# Patient Record
Sex: Male | Born: 1955 | Race: Black or African American | Hispanic: No | Marital: Single | Smoking: Never smoker
Health system: Southern US, Community
[De-identification: ages and names within clinical notes are randomized; demographics above are authoritative.]

---

## 2020-07-27 ENCOUNTER — Other Ambulatory Visit: Payer: Self-pay

## 2020-07-27 ENCOUNTER — Inpatient Hospital Stay (HOSPITAL_COMMUNITY)
Admission: EM | Admit: 2020-07-27 | Discharge: 2020-08-17 | DRG: 981 | Disposition: E | Payer: HRSA Program | Attending: Family Medicine | Admitting: Family Medicine

## 2020-07-27 ENCOUNTER — Emergency Department (HOSPITAL_COMMUNITY): Payer: HRSA Program

## 2020-07-27 DIAGNOSIS — E876 Hypokalemia: Secondary | ICD-10-CM | POA: Diagnosis not present

## 2020-07-27 DIAGNOSIS — I700 Atherosclerosis of aorta: Secondary | ICD-10-CM | POA: Diagnosis present

## 2020-07-27 DIAGNOSIS — G9341 Metabolic encephalopathy: Secondary | ICD-10-CM | POA: Diagnosis present

## 2020-07-27 DIAGNOSIS — A4151 Sepsis due to Escherichia coli [E. coli]: Secondary | ICD-10-CM | POA: Diagnosis present

## 2020-07-27 DIAGNOSIS — R680 Hypothermia, not associated with low environmental temperature: Secondary | ICD-10-CM | POA: Diagnosis present

## 2020-07-27 DIAGNOSIS — J0100 Acute maxillary sinusitis, unspecified: Secondary | ICD-10-CM | POA: Diagnosis present

## 2020-07-27 DIAGNOSIS — R627 Adult failure to thrive: Secondary | ICD-10-CM | POA: Diagnosis present

## 2020-07-27 DIAGNOSIS — E1122 Type 2 diabetes mellitus with diabetic chronic kidney disease: Secondary | ICD-10-CM | POA: Diagnosis present

## 2020-07-27 DIAGNOSIS — R112 Nausea with vomiting, unspecified: Secondary | ICD-10-CM

## 2020-07-27 DIAGNOSIS — A4189 Other specified sepsis: Secondary | ICD-10-CM

## 2020-07-27 DIAGNOSIS — J1282 Pneumonia due to coronavirus disease 2019: Secondary | ICD-10-CM | POA: Diagnosis present

## 2020-07-27 DIAGNOSIS — E43 Unspecified severe protein-calorie malnutrition: Secondary | ICD-10-CM

## 2020-07-27 DIAGNOSIS — I82442 Acute embolism and thrombosis of left tibial vein: Secondary | ICD-10-CM | POA: Diagnosis present

## 2020-07-27 DIAGNOSIS — I9751 Accidental puncture and laceration of a circulatory system organ or structure during a circulatory system procedure: Secondary | ICD-10-CM | POA: Diagnosis not present

## 2020-07-27 DIAGNOSIS — U071 COVID-19: Principal | ICD-10-CM | POA: Diagnosis present

## 2020-07-27 DIAGNOSIS — Z681 Body mass index (BMI) 19 or less, adult: Secondary | ICD-10-CM

## 2020-07-27 DIAGNOSIS — D62 Acute posthemorrhagic anemia: Secondary | ICD-10-CM | POA: Diagnosis not present

## 2020-07-27 DIAGNOSIS — N390 Urinary tract infection, site not specified: Secondary | ICD-10-CM | POA: Diagnosis present

## 2020-07-27 DIAGNOSIS — I70262 Atherosclerosis of native arteries of extremities with gangrene, left leg: Secondary | ICD-10-CM | POA: Diagnosis present

## 2020-07-27 DIAGNOSIS — R64 Cachexia: Secondary | ICD-10-CM | POA: Diagnosis present

## 2020-07-27 DIAGNOSIS — E872 Acidosis, unspecified: Secondary | ICD-10-CM | POA: Diagnosis present

## 2020-07-27 DIAGNOSIS — I82452 Acute embolism and thrombosis of left peroneal vein: Secondary | ICD-10-CM | POA: Diagnosis present

## 2020-07-27 DIAGNOSIS — R1310 Dysphagia, unspecified: Secondary | ICD-10-CM | POA: Diagnosis present

## 2020-07-27 DIAGNOSIS — R0902 Hypoxemia: Secondary | ICD-10-CM

## 2020-07-27 DIAGNOSIS — I4581 Long QT syndrome: Secondary | ICD-10-CM | POA: Diagnosis present

## 2020-07-27 DIAGNOSIS — Z794 Long term (current) use of insulin: Secondary | ICD-10-CM

## 2020-07-27 DIAGNOSIS — A419 Sepsis, unspecified organism: Secondary | ICD-10-CM

## 2020-07-27 DIAGNOSIS — Y838 Other surgical procedures as the cause of abnormal reaction of the patient, or of later complication, without mention of misadventure at the time of the procedure: Secondary | ICD-10-CM | POA: Diagnosis not present

## 2020-07-27 DIAGNOSIS — N179 Acute kidney failure, unspecified: Secondary | ICD-10-CM | POA: Diagnosis present

## 2020-07-27 DIAGNOSIS — E11621 Type 2 diabetes mellitus with foot ulcer: Secondary | ICD-10-CM | POA: Diagnosis present

## 2020-07-27 DIAGNOSIS — E1152 Type 2 diabetes mellitus with diabetic peripheral angiopathy with gangrene: Secondary | ICD-10-CM | POA: Diagnosis present

## 2020-07-27 DIAGNOSIS — E559 Vitamin D deficiency, unspecified: Secondary | ICD-10-CM | POA: Diagnosis present

## 2020-07-27 DIAGNOSIS — R9431 Abnormal electrocardiogram [ECG] [EKG]: Secondary | ICD-10-CM | POA: Diagnosis present

## 2020-07-27 DIAGNOSIS — L97519 Non-pressure chronic ulcer of other part of right foot with unspecified severity: Secondary | ICD-10-CM | POA: Diagnosis present

## 2020-07-27 DIAGNOSIS — E875 Hyperkalemia: Secondary | ICD-10-CM | POA: Diagnosis present

## 2020-07-27 DIAGNOSIS — N400 Enlarged prostate without lower urinary tract symptoms: Secondary | ICD-10-CM | POA: Diagnosis present

## 2020-07-27 DIAGNOSIS — L97528 Non-pressure chronic ulcer of other part of left foot with other specified severity: Secondary | ICD-10-CM | POA: Diagnosis present

## 2020-07-27 DIAGNOSIS — J9601 Acute respiratory failure with hypoxia: Secondary | ICD-10-CM | POA: Diagnosis present

## 2020-07-27 DIAGNOSIS — E1110 Type 2 diabetes mellitus with ketoacidosis without coma: Secondary | ICD-10-CM | POA: Diagnosis present

## 2020-07-27 DIAGNOSIS — I82411 Acute embolism and thrombosis of right femoral vein: Secondary | ICD-10-CM | POA: Diagnosis present

## 2020-07-27 DIAGNOSIS — N1832 Chronic kidney disease, stage 3b: Secondary | ICD-10-CM | POA: Diagnosis present

## 2020-07-27 DIAGNOSIS — E1169 Type 2 diabetes mellitus with other specified complication: Secondary | ICD-10-CM | POA: Diagnosis present

## 2020-07-27 DIAGNOSIS — E782 Mixed hyperlipidemia: Secondary | ICD-10-CM | POA: Diagnosis present

## 2020-07-27 LAB — CBG MONITORING, ED: Glucose-Capillary: >600 mg/dL (ref 70–99)

## 2020-07-27 MED ORDER — LACTATED RINGERS IV BOLUS
1000.0000 mL | Freq: Once | INTRAVENOUS | Status: AC
  Administered 2020-07-27: 1000 mL via INTRAVENOUS

## 2020-07-27 NOTE — ED Notes (Signed)
RN unable to start IV, EMS tried as well. IV team consulted.

## 2020-07-27 NOTE — ED Triage Notes (Signed)
Pt arrived from home where family called due to pt "not acting like self" and family unable to manage pt's glucose. Family reported pt sugar has been high and they administered 8 units Humalog about 3 hours ago. CBG with EMS 386. Pt oriented but slow to respond. Pt also has wounds on toes, family stated pt always wears compression stockings so they were unaware, pt states they have been like this for about 8 months.

## 2020-07-27 NOTE — ED Notes (Signed)
Please call [facility] [place] @ [phone] for status update--Gentle Hoge

## 2020-07-27 NOTE — ED Provider Notes (Signed)
[facility] Provider Note   CSN: [id number] Arrival date & time: 08/09/2020  2223     History Chief Complaint  Patient presents with  . Altered Mental Status  . Hyperglycemia    Todd MaxinJames Gomer is a 64 y.o. male.  Patient to ED from home for uncontrolled blood sugar. Per EMS, the family reports he is "not acting like himself" today. The patient denies pain, vomiting, diarrhea. He reports he does not feel well. He denies SOB. He reports he does not have a POA and makes his own medical decisions.   The history is provided by the patient and the EMS personnel. No language interpreter was used.  Altered Mental Status Presenting symptoms: confusion   Associated symptoms: weakness   Associated symptoms: no fever   Hyperglycemia Associated symptoms: altered mental status, confusion and weakness   Associated symptoms: no fever        No past medical history on file.  Patient Active Problem List   Diagnosis Date Noted  . Diabetic ketoacidosis (HCC) 07/28/2020    No family history on file.  Social History   Tobacco Use  . Smoking status: Not on file  Substance Use Topics  . Alcohol use: Not on file  . Drug use: Not on file    Home Medications Prior to Admission medications   Not on File    Allergies    Patient has no known allergies.  Review of Systems   Review of Systems  Constitutional: Negative for chills and fever.  HENT: Negative.   Respiratory: Negative.   Cardiovascular: Negative.   Gastrointestinal: Negative.   Genitourinary: Negative.   Musculoskeletal: Negative.   Skin: Negative.   Neurological: Positive for weakness.  Psychiatric/Behavioral: Positive for confusion.    Physical Exam Updated Vital Signs BP (!) 154/86   Pulse (!) 102   Temp (!) 97.1 F (36.2 C) (Rectal)   Resp (!) 27   Ht 5\' 7"  (1.702 m)   Wt 43.1 kg   SpO2 96%   BMI 14.88 kg/m   Physical Exam Vitals and nursing note reviewed.    Constitutional:      Appearance: He is well-developed. He is ill-appearing.     Comments: Patient underweight, immaciated  HENT:     Head: Normocephalic.     Mouth/Throat:     Mouth: Mucous membranes are dry.     Comments: Majority of dentition missing Eyes:     Conjunctiva/sclera: Conjunctivae normal.  Cardiovascular:     Rate and Rhythm: Regular rhythm. Tachycardia present.     Heart sounds: No murmur heard.   Pulmonary:     Effort: Pulmonary effort is normal.     Breath sounds: Normal breath sounds. No wheezing, rhonchi or rales.     Comments: Tachypneic Abdominal:     General: Bowel sounds are normal. There is no distension.     Palpations: Abdomen is soft.     Tenderness: There is no abdominal tenderness. There is no guarding or rebound.  Musculoskeletal:        General: Normal range of motion.     Cervical back: Normal range of motion and neck supple.     Right lower leg: No edema.     Left lower leg: No edema.  Skin:    General: Skin is warm and dry.     Comments: Poor skin turgor  Neurological:     Mental Status: He is alert.     Comments: Patient oriented to  person, place. Unable to provide details of medical history. Awake, alert. Follows command. No lateralizing weakness.     ED Results / Procedures / Treatments   Labs (all labs ordered are listed, but only abnormal results are displayed) Labs Reviewed  SARS CORONAVIRUS 2 BY RT PCR (HOSPITAL ORDER, PERFORMED IN [facility]) - Abnormal; Notable for the following components:      Result Value   SARS Coronavirus 2 POSITIVE (*)    All other components within normal limits  CBC WITH DIFFERENTIAL/PLATELET - Abnormal; Notable for the following components:   WBC 15.9 (*)    RBC 6.48 (*)    Hemoglobin 18.2 (*)    HCT 59.2 (*)    Neutro Abs 14.6 (*)    Abs Immature Granulocytes 0.31 (*)    All other components within normal limits  COMPREHENSIVE METABOLIC PANEL - Abnormal; Notable for the following  components:   Potassium 6.2 (*)    Chloride 85 (*)    CO2 11 (*)    Glucose, Bld 788 (*)    BUN 131 (*)    Creatinine, Ser 4.38 (*)    Calcium 8.7 (*)    Total Protein 8.3 (*)    Albumin 2.4 (*)    AST 47 (*)    Total Bilirubin 3.2 (*)    GFR calc non Af Amer 13 (*)    GFR calc Af Amer 15 (*)    Anion gap 39 (*)    All other components within normal limits  LACTIC ACID, PLASMA - Abnormal; Notable for the following components:   Lactic Acid, Venous 8.6 (*)    All other components within normal limits  LACTIC ACID, PLASMA - Abnormal; Notable for the following components:   Lactic Acid, Venous 3.8 (*)    All other components within normal limits  BETA-HYDROXYBUTYRIC ACID - Abnormal; Notable for the following components:   Beta-Hydroxybutyric Acid 6.29 (*)    All other components within normal limits  CBG MONITORING, ED - Abnormal; Notable for the following components:   Glucose-Capillary >600 (*)    All other components within normal limits  I-STAT VENOUS BLOOD GAS, ED - Abnormal; Notable for the following components:   pCO2, Ven 35.9 (*)    pO2, Ven 100.0 (*)    Acid-base deficit 4.0 (*)    Calcium, Ion 0.88 (*)    All other components within normal limits  CBG MONITORING, ED - Abnormal; Notable for the following components:   Glucose-Capillary 497 (*)    All other components within normal limits  CBG MONITORING, ED - Abnormal; Notable for the following components:   Glucose-Capillary 465 (*)    All other components within normal limits  CULTURE, BLOOD (ROUTINE X 2)  CULTURE, BLOOD (ROUTINE X 2)  URINE CULTURE  PROTIME-INR  APTT  URINALYSIS, ROUTINE W REFLEX MICROSCOPIC  BETA-HYDROXYBUTYRIC ACID  BETA-HYDROXYBUTYRIC ACID  BASIC METABOLIC PANEL  I-STAT ARTERIAL BLOOD GAS, ED   Results for orders placed or performed during the hospital encounter of 08/09/2020  SARS Coronavirus 2 by RT PCR (hospital order, performed in [facility] hospital lab) Nasopharyngeal  Nasopharyngeal Swab   Specimen: Nasopharyngeal Swab  Result Value Ref Range   SARS Coronavirus 2 POSITIVE (A) NEGATIVE  CBC with Differential  Result Value Ref Range   WBC 15.9 (H) 4.0 - 10.5 K/uL   RBC 6.48 (H) 4.22 - 5.81 MIL/uL   Hemoglobin 18.2 (H) 13.0 - 17.0 g/dL   HCT 93.2 (H) 39 - 52 %  MCV 91.4 80.0 - 100.0 fL   MCH 28.1 26.0 - 34.0 pg   MCHC 30.7 30.0 - 36.0 g/dL   RDW 19.1 47.8 - 29.5 %   Platelets 360 150 - 400 K/uL   nRBC 0.0 0.0 - 0.2 %   Neutrophils Relative % 92 %   Neutro Abs 14.6 (H) 1.7 - 7.7 K/uL   Lymphocytes Relative 4 %   Lymphs Abs 0.7 0.7 - 4.0 K/uL   Monocytes Relative 2 %   Monocytes Absolute 0.3 0 - 1 K/uL   Eosinophils Relative 0 %   Eosinophils Absolute 0.0 0 - 0 K/uL   Basophils Relative 0 %   Basophils Absolute 0.0 0 - 0 K/uL   Immature Granulocytes 2 %   Abs Immature Granulocytes 0.31 (H) 0.00 - 0.07 K/uL  Comprehensive metabolic panel  Result Value Ref Range   Sodium 135 135 - 145 mmol/L   Potassium 6.2 (H) 3.5 - 5.1 mmol/L   Chloride 85 (L) 98 - 111 mmol/L   CO2 11 (L) 22 - 32 mmol/L   Glucose, Bld 788 (HH) 70 - 99 mg/dL   BUN 621 (H) 8 - 23 mg/dL   Creatinine, Ser 3.08 (H) 0.61 - 1.24 mg/dL   Calcium 8.7 (L) 8.9 - 10.3 mg/dL   Total Protein 8.3 (H) 6.5 - 8.1 g/dL   Albumin 2.4 (L) 3.5 - 5.0 g/dL   AST 47 (H) 15 - 41 U/L   ALT 15 0 - 44 U/L   Alkaline Phosphatase 83 38 - 126 U/L   Total Bilirubin 3.2 (H) 0.3 - 1.2 mg/dL   GFR calc non Af Amer 13 (L) >60 mL/min   GFR calc Af Amer 15 (L) >60 mL/min   Anion gap 39 (H) 5 - 15  Lactic acid, plasma  Result Value Ref Range   Lactic Acid, Venous 8.6 (HH) 0.5 - 1.9 mmol/L  Lactic acid, plasma  Result Value Ref Range   Lactic Acid, Venous 3.8 (HH) 0.5 - 1.9 mmol/L  Protime-INR  Result Value Ref Range   Prothrombin Time 13.9 11.4 - 15.2 seconds   INR 1.1 0.8 - 1.2  APTT  Result Value Ref Range   aPTT 28 24 - 36 seconds  Beta-hydroxybutyric acid  Result Value Ref Range    Beta-Hydroxybutyric Acid 6.29 (H) 0.05 - 0.27 mmol/L  CBG monitoring, ED  Result Value Ref Range   Glucose-Capillary >600 (HH) 70 - 99 mg/dL  I-Stat Venous Blood Gas, ED  Result Value Ref Range   pH, Ven 7.367 7.25 - 7.43   pCO2, Ven 35.9 (L) 44 - 60 mmHg   pO2, Ven 100.0 (H) 32 - 45 mmHg   Bicarbonate 20.6 20.0 - 28.0 mmol/L   TCO2 22 22 - 32 mmol/L   O2 Saturation 98.0 %   Acid-base deficit 4.0 (H) 0.0 - 2.0 mmol/L   Sodium 136 135 - 145 mmol/L   Potassium 4.1 3.5 - 5.1 mmol/L   Calcium, Ion 0.88 (LL) 1.15 - 1.40 mmol/L   HCT 48.0 39 - 52 %   Hemoglobin 16.3 13.0 - 17.0 g/dL   Sample type VENOUS   CBG monitoring, ED  Result Value Ref Range   Glucose-Capillary 497 (H) 70 - 99 mg/dL  CBG monitoring, ED  Result Value Ref Range   Glucose-Capillary 465 (H) 70 - 99 mg/dL    EKG None  Radiology DG Chest Port 1 View  Result Date: August 09, 2020 CLINICAL DATA:  Shortness of breath EXAM:  PORTABLE CHEST 1 VIEW COMPARISON:  01/27/2014 FINDINGS: Low lung volumes. Left basilar airspace opacity, new since prior study. Heart is normal size. Right lung clear. No effusions or acute bony abnormality. IMPRESSION: Low lung volumes. Left basilar airspace opacity concerning for pneumonia. Electronically Signed   By: Charlett Nose M.D.   On: 07/21/2020 23:03    Procedures Procedures (including critical care time) CRITICAL CARE Performed by: Arnoldo Hooker   Total critical care time: 55 minutes  Critical care time was exclusive of separately billable procedures and treating other patients.  Critical care was necessary to treat or prevent imminent or life-threatening deterioration.  Critical care was time spent personally by me on the following activities: development of treatment plan with patient and/or surrogate as well as nursing, discussions with consultants, evaluation of patient's response to treatment, examination of patient, obtaining history from patient or surrogate, ordering and  performing treatments and interventions, ordering and review of laboratory studies, ordering and review of radiographic studies, pulse oximetry and re-evaluation of patient's condition.  Medications Ordered in ED Medications  metroNIDAZOLE (FLAGYL) IVPB 500 mg (0 mg Intravenous Stopped 07/28/20 0135)  insulin regular, human (MYXREDLIN) 100 units/ 100 mL infusion (6 Units/hr Intravenous Rate/Dose Change 07/28/20 0322)  dextrose 5 % in lactated ringers infusion (has no administration in time range)  dextrose 50 % solution 0-50 mL (has no administration in time range)  lactated ringers bolus 1,000 mL (0 mLs Intravenous Stopped 07/28/20 0106)  ceFEPIme (MAXIPIME) 2 g in sodium chloride 0.9 % 100 mL IVPB (0 g Intravenous Stopped 07/28/20 0053)  vancomycin (VANCOCIN) IVPB 1000 mg/200 mL premix (0 mg Intravenous Stopped 07/28/20 0250)  lactated ringers bolus 500 mL (0 mLs Intravenous Stopped 07/28/20 0134)  0.9 %  sodium chloride infusion ( Intravenous New Bag/Given 07/28/20 0144)    ED Course  I have reviewed the triage vital signs and the nursing notes.  Pertinent labs & imaging results that were available during my care of the patient were reviewed by me and considered in my medical decision making (see chart for details).    MDM Rules/Calculators/A&P                          Neuro exam  Patient to ED with c/o "not acting right" and uncontrolled blood sugar.   The patient appears chronically ill, dry. He meets sepsis criteria with hypotension, tachycardia. Protocol started, including 30 ml/kg LR bolus, abx for unknown source.   Hypothermic with rectal temp 95 - bear hugger applied. Will recheck and observe.   Hypoxic to 85% on arrival to ED requiring oxygen support. Eventually weaned to 4L - maintaining 90%. No respiratory difficulty. Denies SoB.  Initial CBG is "high". He is found to be is DKA with Glu 788, CO2 11, Anion Gap 39. DKA orders started to include insulin drip with endotool. No  vomiting. K+ 6.2. Expect improvement with glycemic control, to be addressed with DKA standing orders. LR changed to NS for fluid resuscitation.   Cr elevated to 4.38, BUN 131. Unsure of baseline renal function. Expect improvement with fluids.   Patient has a left basilar opacity on CXR. He is COVID positive. ^^^the patient expresses he has religious beliefs that keep him from being COVID tested. Mental capacity questionable. He will require admission. He agrees to allow the test but does not want to receive any results. Dr. Wilkie Aye is involved in this discussion with the patient.   Lactic acid 8.6.  Critical care consulted. Requests pH which results as normal. Repeat Lactic acid much improved at 3.8. Patient remains awake and alert, unchanged mental status on serial exams.   PH is normal on VBG (7.367). Lactic acid improving and is 3.8 on repeat value. Hospitalist paged for admission. Discussed with Dr. Leafy Half, [facility], who accepts for admission.   Final Clinical Impression(s) / ED Diagnoses Final diagnoses:  Sepsis (HCC)  DKA COVID+ AKI  Rx / DC Orders ED Discharge Orders    None       Elpidio Anis, PA-C 07/28/20 0416    Shon Baton, MD 07/28/20 [phone]

## 2020-07-27 NOTE — ED Notes (Signed)
RN explained to pt that covid test was ordered, pt allowed RN to swab him. Pt informed PA afterwards that covid swab is against his religion. Swab disposed of per pt request. PA is aware.

## 2020-07-27 NOTE — ED Notes (Signed)
RN informed PA that not all lab work is able to be obtained.

## 2020-07-27 NOTE — ED Notes (Signed)
PA notified of pt temperature. Bear Hugger placed on pt.

## 2020-07-27 NOTE — ED Notes (Signed)
Pt RR 44 on arrival and O2 saturation was 85%. Pt placed on 4L [place] and improved to 89%. Pt now on non-rebreather at 95%

## 2020-07-27 NOTE — ED Notes (Signed)
IV team only able to obtain one culture bottle. Unable to obtain second set.

## 2020-07-28 ENCOUNTER — Encounter (HOSPITAL_COMMUNITY): Payer: Self-pay | Admitting: Internal Medicine

## 2020-07-28 ENCOUNTER — Inpatient Hospital Stay (HOSPITAL_COMMUNITY): Payer: HRSA Program

## 2020-07-28 DIAGNOSIS — R627 Adult failure to thrive: Secondary | ICD-10-CM | POA: Diagnosis present

## 2020-07-28 DIAGNOSIS — G9341 Metabolic encephalopathy: Secondary | ICD-10-CM

## 2020-07-28 DIAGNOSIS — I82452 Acute embolism and thrombosis of left peroneal vein: Secondary | ICD-10-CM | POA: Diagnosis present

## 2020-07-28 DIAGNOSIS — U071 COVID-19: Principal | ICD-10-CM

## 2020-07-28 DIAGNOSIS — N179 Acute kidney failure, unspecified: Secondary | ICD-10-CM | POA: Diagnosis present

## 2020-07-28 DIAGNOSIS — J9601 Acute respiratory failure with hypoxia: Secondary | ICD-10-CM | POA: Diagnosis present

## 2020-07-28 DIAGNOSIS — N390 Urinary tract infection, site not specified: Secondary | ICD-10-CM | POA: Diagnosis present

## 2020-07-28 DIAGNOSIS — E875 Hyperkalemia: Secondary | ICD-10-CM | POA: Diagnosis present

## 2020-07-28 DIAGNOSIS — E43 Unspecified severe protein-calorie malnutrition: Secondary | ICD-10-CM | POA: Diagnosis present

## 2020-07-28 DIAGNOSIS — A4151 Sepsis due to Escherichia coli [E. coli]: Secondary | ICD-10-CM | POA: Diagnosis present

## 2020-07-28 DIAGNOSIS — N1832 Chronic kidney disease, stage 3b: Secondary | ICD-10-CM

## 2020-07-28 DIAGNOSIS — N400 Enlarged prostate without lower urinary tract symptoms: Secondary | ICD-10-CM

## 2020-07-28 DIAGNOSIS — I70262 Atherosclerosis of native arteries of extremities with gangrene, left leg: Secondary | ICD-10-CM | POA: Diagnosis present

## 2020-07-28 DIAGNOSIS — E872 Acidosis, unspecified: Secondary | ICD-10-CM | POA: Diagnosis present

## 2020-07-28 DIAGNOSIS — I469 Cardiac arrest, cause unspecified: Secondary | ICD-10-CM | POA: Diagnosis not present

## 2020-07-28 DIAGNOSIS — I82411 Acute embolism and thrombosis of right femoral vein: Secondary | ICD-10-CM | POA: Diagnosis present

## 2020-07-28 DIAGNOSIS — L97528 Non-pressure chronic ulcer of other part of left foot with other specified severity: Secondary | ICD-10-CM | POA: Diagnosis present

## 2020-07-28 DIAGNOSIS — I9751 Accidental puncture and laceration of a circulatory system organ or structure during a circulatory system procedure: Secondary | ICD-10-CM | POA: Diagnosis not present

## 2020-07-28 DIAGNOSIS — E1169 Type 2 diabetes mellitus with other specified complication: Secondary | ICD-10-CM

## 2020-07-28 DIAGNOSIS — I82442 Acute embolism and thrombosis of left tibial vein: Secondary | ICD-10-CM | POA: Diagnosis present

## 2020-07-28 DIAGNOSIS — Z681 Body mass index (BMI) 19 or less, adult: Secondary | ICD-10-CM | POA: Diagnosis not present

## 2020-07-28 DIAGNOSIS — D62 Acute posthemorrhagic anemia: Secondary | ICD-10-CM | POA: Diagnosis not present

## 2020-07-28 DIAGNOSIS — R609 Edema, unspecified: Secondary | ICD-10-CM | POA: Diagnosis not present

## 2020-07-28 DIAGNOSIS — Y838 Other surgical procedures as the cause of abnormal reaction of the patient, or of later complication, without mention of misadventure at the time of the procedure: Secondary | ICD-10-CM | POA: Diagnosis not present

## 2020-07-28 DIAGNOSIS — E1110 Type 2 diabetes mellitus with ketoacidosis without coma: Secondary | ICD-10-CM | POA: Diagnosis present

## 2020-07-28 DIAGNOSIS — L97519 Non-pressure chronic ulcer of other part of right foot with unspecified severity: Secondary | ICD-10-CM | POA: Diagnosis present

## 2020-07-28 DIAGNOSIS — E1152 Type 2 diabetes mellitus with diabetic peripheral angiopathy with gangrene: Secondary | ICD-10-CM | POA: Diagnosis present

## 2020-07-28 DIAGNOSIS — A4189 Other specified sepsis: Secondary | ICD-10-CM

## 2020-07-28 DIAGNOSIS — R64 Cachexia: Secondary | ICD-10-CM | POA: Diagnosis present

## 2020-07-28 DIAGNOSIS — E559 Vitamin D deficiency, unspecified: Secondary | ICD-10-CM

## 2020-07-28 DIAGNOSIS — J1282 Pneumonia due to coronavirus disease 2019: Secondary | ICD-10-CM | POA: Diagnosis present

## 2020-07-28 HISTORY — DX: Type 2 diabetes mellitus with other specified complication: E11.69

## 2020-07-28 HISTORY — DX: Vitamin D deficiency, unspecified: E55.9

## 2020-07-28 HISTORY — DX: Benign prostatic hyperplasia without lower urinary tract symptoms: N40.0

## 2020-07-28 LAB — BLOOD CULTURE ID PANEL (REFLEXED) - BCID2

## 2020-07-28 LAB — CBC WITH DIFFERENTIAL/PLATELET
Abs Immature Granulocytes: 0.31 10*3/uL — ABNORMAL HIGH (ref 0.00–0.07)
Abs Immature Granulocytes: 0.22 10*3/uL — ABNORMAL HIGH (ref 0.00–0.07)
Basophils Absolute: 0 10*3/uL (ref 0.0–0.1)
Basophils Absolute: 0 10*3/uL (ref 0.0–0.1)
Basophils Relative: 0 %
Basophils Relative: 0 %
Eosinophils Absolute: 0 10*3/uL (ref 0.0–0.5)
Eosinophils Absolute: 0 10*3/uL (ref 0.0–0.5)
Eosinophils Relative: 0 %
Eosinophils Relative: 0 %
HCT: 59.2 % — ABNORMAL HIGH (ref 39.0–52.0)
HCT: 45.8 % (ref 39.0–52.0)
Hemoglobin: 18.2 g/dL — ABNORMAL HIGH (ref 13.0–17.0)
Hemoglobin: 14.8 g/dL (ref 13.0–17.0)
Immature Granulocytes: 2 %
Immature Granulocytes: 1 %
Lymphocytes Relative: 4 %
Lymphocytes Relative: 2 %
Lymphs Abs: 0.7 10*3/uL (ref 0.7–4.0)
Lymphs Abs: 0.4 10*3/uL — ABNORMAL LOW (ref 0.7–4.0)
MCH: 28.1 pg (ref 26.0–34.0)
MCH: 27.8 pg (ref 26.0–34.0)
MCHC: 30.7 g/dL (ref 30.0–36.0)
MCHC: 32.3 g/dL (ref 30.0–36.0)
MCV: 91.4 fL (ref 80.0–100.0)
MCV: 86.1 fL (ref 80.0–100.0)
Monocytes Absolute: 0.3 10*3/uL (ref 0.1–1.0)
Monocytes Absolute: 0.4 10*3/uL (ref 0.1–1.0)
Monocytes Relative: 2 %
Monocytes Relative: 2 %
Neutro Abs: 14.6 10*3/uL — ABNORMAL HIGH (ref 1.7–7.7)
Neutro Abs: 16.8 10*3/uL — ABNORMAL HIGH (ref 1.7–7.7)
Neutrophils Relative %: 92 %
Neutrophils Relative %: 95 %
Platelets: 360 10*3/uL (ref 150–400)
Platelets: 269 10*3/uL (ref 150–400)
RBC: 6.48 MIL/uL — ABNORMAL HIGH (ref 4.22–5.81)
RBC: 5.32 MIL/uL (ref 4.22–5.81)
RDW: 13.1 % (ref 11.5–15.5)
RDW: 12.3 % (ref 11.5–15.5)
WBC: 15.9 10*3/uL — ABNORMAL HIGH (ref 4.0–10.5)
WBC: 17.8 10*3/uL — ABNORMAL HIGH (ref 4.0–10.5)
nRBC: 0 % (ref 0.0–0.2)
nRBC: 0 % (ref 0.0–0.2)

## 2020-07-28 LAB — BASIC METABOLIC PANEL
Anion gap: 17 — ABNORMAL HIGH (ref 5–15)
Anion gap: 12 (ref 5–15)
Anion gap: 9 (ref 5–15)
BUN: 108 mg/dL — ABNORMAL HIGH (ref 8–23)
BUN: 95 mg/dL — ABNORMAL HIGH (ref 8–23)
BUN: 71 mg/dL — ABNORMAL HIGH (ref 8–23)
CO2: 24 mmol/L (ref 22–32)
CO2: 28 mmol/L (ref 22–32)
CO2: 20 mmol/L — ABNORMAL LOW (ref 22–32)
Calcium: 6.9 mg/dL — ABNORMAL LOW (ref 8.9–10.3)
Calcium: 8 mg/dL — ABNORMAL LOW (ref 8.9–10.3)
Calcium: 5.5 mg/dL — CL (ref 8.9–10.3)
Chloride: 101 mmol/L (ref 98–111)
Chloride: 105 mmol/L (ref 98–111)
Chloride: 116 mmol/L — ABNORMAL HIGH (ref 98–111)
Creatinine, Ser: 3.18 mg/dL — ABNORMAL HIGH (ref 0.61–1.24)
Creatinine, Ser: 2.75 mg/dL — ABNORMAL HIGH (ref 0.61–1.24)
Creatinine, Ser: 1.93 mg/dL — ABNORMAL HIGH (ref 0.61–1.24)
GFR calc Af Amer: 23 mL/min — ABNORMAL LOW (ref >60)
GFR calc Af Amer: 27 mL/min — ABNORMAL LOW (ref >60)
GFR calc Af Amer: 42 mL/min — ABNORMAL LOW (ref >60)
GFR calc non Af Amer: 20 mL/min — ABNORMAL LOW (ref >60)
GFR calc non Af Amer: 23 mL/min — ABNORMAL LOW (ref >60)
GFR calc non Af Amer: 36 mL/min — ABNORMAL LOW (ref >60)
Glucose, Bld: 303 mg/dL — ABNORMAL HIGH (ref 70–99)
Glucose, Bld: 109 mg/dL — ABNORMAL HIGH (ref 70–99)
Glucose, Bld: 300 mg/dL — ABNORMAL HIGH (ref 70–99)
Potassium: 3.1 mmol/L — ABNORMAL LOW (ref 3.5–5.1)
Potassium: 4.2 mmol/L (ref 3.5–5.1)
Potassium: 3.8 mmol/L (ref 3.5–5.1)
Sodium: 142 mmol/L (ref 135–145)
Sodium: 145 mmol/L (ref 135–145)
Sodium: 145 mmol/L (ref 135–145)

## 2020-07-28 LAB — CBG MONITORING, ED
Glucose-Capillary: 497 mg/dL — ABNORMAL HIGH (ref 70–99)
Glucose-Capillary: 465 mg/dL — ABNORMAL HIGH (ref 70–99)
Glucose-Capillary: 326 mg/dL — ABNORMAL HIGH (ref 70–99)
Glucose-Capillary: 220 mg/dL — ABNORMAL HIGH (ref 70–99)
Glucose-Capillary: 199 mg/dL — ABNORMAL HIGH (ref 70–99)
Glucose-Capillary: 127 mg/dL — ABNORMAL HIGH (ref 70–99)
Glucose-Capillary: 158 mg/dL — ABNORMAL HIGH (ref 70–99)
Glucose-Capillary: 148 mg/dL — ABNORMAL HIGH (ref 70–99)
Glucose-Capillary: 131 mg/dL — ABNORMAL HIGH (ref 70–99)
Glucose-Capillary: 139 mg/dL — ABNORMAL HIGH (ref 70–99)
Glucose-Capillary: 80 mg/dL (ref 70–99)
Glucose-Capillary: 143 mg/dL — ABNORMAL HIGH (ref 70–99)
Glucose-Capillary: 223 mg/dL — ABNORMAL HIGH (ref 70–99)

## 2020-07-28 LAB — COMPREHENSIVE METABOLIC PANEL
ALT: 15 U/L (ref 0–44)
AST: 47 U/L — ABNORMAL HIGH (ref 15–41)
Albumin: 2.4 g/dL — ABNORMAL LOW (ref 3.5–5.0)
Alkaline Phosphatase: 83 U/L (ref 38–126)
Anion gap: 39 — ABNORMAL HIGH (ref 5–15)
BUN: 131 mg/dL — ABNORMAL HIGH (ref 8–23)
CO2: 11 mmol/L — ABNORMAL LOW (ref 22–32)
Calcium: 8.7 mg/dL — ABNORMAL LOW (ref 8.9–10.3)
Chloride: 85 mmol/L — ABNORMAL LOW (ref 98–111)
Creatinine, Ser: 4.38 mg/dL — ABNORMAL HIGH (ref 0.61–1.24)
GFR calc Af Amer: 15 mL/min — ABNORMAL LOW (ref >60)
GFR calc non Af Amer: 13 mL/min — ABNORMAL LOW (ref >60)
Glucose, Bld: 788 mg/dL (ref 70–99)
Potassium: 6.2 mmol/L — ABNORMAL HIGH (ref 3.5–5.1)
Sodium: 135 mmol/L (ref 135–145)
Total Bilirubin: 3.2 mg/dL — ABNORMAL HIGH (ref 0.3–1.2)
Total Protein: 8.3 g/dL — ABNORMAL HIGH (ref 6.5–8.1)

## 2020-07-28 LAB — I-STAT VENOUS BLOOD GAS, ED
Acid-base deficit: 4 mmol/L — ABNORMAL HIGH (ref 0.0–2.0)
Bicarbonate: 20.6 mmol/L (ref 20.0–28.0)
Calcium, Ion: 0.88 mmol/L — CL (ref 1.15–1.40)
HCT: 48 % (ref 39.0–52.0)
Hemoglobin: 16.3 g/dL (ref 13.0–17.0)
O2 Saturation: 98 %
Potassium: 4.1 mmol/L (ref 3.5–5.1)
Sodium: 136 mmol/L (ref 135–145)
TCO2: 22 mmol/L (ref 22–32)
pCO2, Ven: 35.9 mmHg — ABNORMAL LOW (ref 44.0–60.0)
pH, Ven: 7.367 (ref 7.250–7.430)
pO2, Ven: 100 mmHg — ABNORMAL HIGH (ref 32.0–45.0)

## 2020-07-28 LAB — SEDIMENTATION RATE: Sed Rate: 62 mm/hr — ABNORMAL HIGH (ref 0–16)

## 2020-07-28 LAB — C-REACTIVE PROTEIN: CRP: 25.8 mg/dL — ABNORMAL HIGH (ref <1.0)

## 2020-07-28 LAB — PROTIME-INR
INR: 1.1 (ref 0.8–1.2)
Prothrombin Time: 13.9 seconds (ref 11.4–15.2)

## 2020-07-28 LAB — URINALYSIS, ROUTINE W REFLEX MICROSCOPIC
Bilirubin Urine: NEGATIVE
Glucose, UA: 150 mg/dL — AB
Ketones, ur: NEGATIVE mg/dL
Nitrite: NEGATIVE
Protein, ur: 30 mg/dL — AB
Specific Gravity, Urine: 1.012 (ref 1.005–1.030)
pH: 5 (ref 5.0–8.0)

## 2020-07-28 LAB — LACTIC ACID, PLASMA
Lactic Acid, Venous: 8.6 mmol/L (ref 0.5–1.9)
Lactic Acid, Venous: 3.8 mmol/L (ref 0.5–1.9)
Lactic Acid, Venous: 5.2 mmol/L (ref 0.5–1.9)
Lactic Acid, Venous: 3.2 mmol/L (ref 0.5–1.9)
Lactic Acid, Venous: 2 mmol/L (ref 0.5–1.9)

## 2020-07-28 LAB — BETA-HYDROXYBUTYRIC ACID
Beta-Hydroxybutyric Acid: 6.29 mmol/L — ABNORMAL HIGH (ref 0.05–0.27)
Beta-Hydroxybutyric Acid: 0.16 mmol/L (ref 0.05–0.27)
Beta-Hydroxybutyric Acid: 0.07 mmol/L (ref 0.05–0.27)
Beta-Hydroxybutyric Acid: 1.94 mmol/L — ABNORMAL HIGH (ref 0.05–0.27)

## 2020-07-28 LAB — T4, FREE: Free T4: 1.52 ng/dL — ABNORMAL HIGH (ref 0.61–1.12)

## 2020-07-28 LAB — HIV ANTIBODY (ROUTINE TESTING W REFLEX): HIV Screen 4th Generation wRfx: NONREACTIVE

## 2020-07-28 LAB — AMMONIA: Ammonia: 25 umol/L (ref 9–35)

## 2020-07-28 LAB — TSH: TSH: 1.405 u[IU]/mL (ref 0.350–4.500)

## 2020-07-28 LAB — PROCALCITONIN: Procalcitonin: 3.38 ng/mL

## 2020-07-28 LAB — SARS CORONAVIRUS 2 BY RT PCR (HOSPITAL ORDER, PERFORMED IN ~~LOC~~ HOSPITAL LAB): SARS Coronavirus 2: POSITIVE — AB

## 2020-07-28 LAB — APTT: aPTT: 28 seconds (ref 24–36)

## 2020-07-28 LAB — VITAMIN B12: Vitamin B-12: 1137 pg/mL — ABNORMAL HIGH (ref 180–914)

## 2020-07-28 MED ORDER — INSULIN REGULAR(HUMAN) IN NACL 100-0.9 UT/100ML-% IV SOLN
INTRAVENOUS | Status: DC
  Administered 2020-07-28: 5.5 [IU]/h via INTRAVENOUS
  Filled 2020-07-28: qty 100

## 2020-07-28 MED ORDER — LACTATED RINGERS IV SOLN: INTRAVENOUS | Status: DC

## 2020-07-28 MED ORDER — INSULIN ASPART 100 UNIT/ML ~~LOC~~ SOLN
0.0000 [IU] | SUBCUTANEOUS | Status: DC
  Administered 2020-07-28: 3 [IU] via SUBCUTANEOUS
  Administered 2020-07-29: 5 [IU] via SUBCUTANEOUS

## 2020-07-28 MED ORDER — POTASSIUM CHLORIDE 10 MEQ/100ML IV SOLN
10.0000 meq | INTRAVENOUS | Status: DC
  Administered 2020-07-28 (×3): 10 meq via INTRAVENOUS
  Filled 2020-07-28 (×2): qty 100

## 2020-07-28 MED ORDER — SODIUM CHLORIDE 0.9 % IV SOLN: 100.0000 mg | Freq: Every day | INTRAVENOUS | Status: DC

## 2020-07-28 MED ORDER — SODIUM CHLORIDE 0.9 % IV SOLN: Freq: Once | INTRAVENOUS | Status: AC

## 2020-07-28 MED ORDER — SODIUM CHLORIDE 0.9 % IV SOLN: INTRAVENOUS | Status: DC

## 2020-07-28 MED ORDER — CALCIUM GLUCONATE-NACL 1-0.675 GM/50ML-% IV SOLN
1.0000 g | Freq: Once | INTRAVENOUS | Status: AC
  Administered 2020-07-28: 1000 mg via INTRAVENOUS
  Filled 2020-07-28: qty 50

## 2020-07-28 MED ORDER — LACTATED RINGERS IV BOLUS
500.0000 mL | Freq: Once | INTRAVENOUS | Status: AC
  Administered 2020-07-28: 500 mL via INTRAVENOUS

## 2020-07-28 MED ORDER — POTASSIUM CHLORIDE CRYS ER 20 MEQ PO TBCR: 20.0000 meq | EXTENDED_RELEASE_TABLET | Freq: Once | ORAL | Status: DC

## 2020-07-28 MED ORDER — ONDANSETRON HCL 4 MG PO TABS: 4.0000 mg | ORAL_TABLET | Freq: Four times a day (QID) | ORAL | Status: DC | PRN

## 2020-07-28 MED ORDER — INSULIN GLARGINE 100 UNIT/ML ~~LOC~~ SOLN
4.0000 [IU] | SUBCUTANEOUS | Status: DC
  Administered 2020-07-28: 4 [IU] via SUBCUTANEOUS
  Filled 2020-07-28 (×2): qty 0.04

## 2020-07-28 MED ORDER — DEXTROSE 50 % IV SOLN: 0.0000 mL | INTRAVENOUS | Status: DC | PRN

## 2020-07-28 MED ORDER — SODIUM CHLORIDE 0.9 % IV SOLN: 1.0000 g | INTRAVENOUS | Status: DC

## 2020-07-28 MED ORDER — HEPARIN SODIUM (PORCINE) 5000 UNIT/ML IJ SOLN
5000.0000 [IU] | Freq: Three times a day (TID) | INTRAMUSCULAR | Status: DC
  Administered 2020-07-28 – 2020-07-31 (×10): 5000 [IU] via SUBCUTANEOUS
  Filled 2020-07-28 (×10): qty 1

## 2020-07-28 MED ORDER — DEXTROSE IN LACTATED RINGERS 5 % IV SOLN: INTRAVENOUS | Status: DC

## 2020-07-28 MED ORDER — SODIUM CHLORIDE 0.9 % IV SOLN
2.0000 g | Freq: Once | INTRAVENOUS | Status: AC
  Administered 2020-07-28: 2 g via INTRAVENOUS
  Filled 2020-07-28: qty 2

## 2020-07-28 MED ORDER — SODIUM CHLORIDE 0.9 % IV SOLN
100.0000 mg | Freq: Every day | INTRAVENOUS | Status: AC
  Administered 2020-07-29 – 2020-08-01 (×4): 100 mg via INTRAVENOUS
  Filled 2020-07-28 (×5): qty 20

## 2020-07-28 MED ORDER — ACETAMINOPHEN 325 MG PO TABS
650.0000 mg | ORAL_TABLET | Freq: Four times a day (QID) | ORAL | Status: DC | PRN
  Administered 2020-07-31 – 2020-08-10 (×3): 650 mg via ORAL
  Filled 2020-07-28 (×3): qty 2

## 2020-07-28 MED ORDER — DEXTROSE IN LACTATED RINGERS 5 % IV SOLN
INTRAVENOUS | Status: DC
Start: 2020-07-28 — End: 2020-07-28

## 2020-07-28 MED ORDER — SODIUM CHLORIDE 0.9 % IV SOLN
2.0000 g | INTRAVENOUS | Status: DC
  Administered 2020-07-28 – 2020-07-31 (×4): 2 g via INTRAVENOUS
  Filled 2020-07-28 (×4): qty 20

## 2020-07-28 MED ORDER — SODIUM CHLORIDE 0.9 % IV SOLN
200.0000 mg | Freq: Once | INTRAVENOUS | Status: AC
  Administered 2020-07-28: 200 mg via INTRAVENOUS
  Filled 2020-07-28: qty 200

## 2020-07-28 MED ORDER — VANCOMYCIN HCL IN DEXTROSE 1-5 GM/200ML-% IV SOLN
1000.0000 mg | Freq: Once | INTRAVENOUS | Status: AC
  Administered 2020-07-28: 1000 mg via INTRAVENOUS
  Filled 2020-07-28: qty 200

## 2020-07-28 MED ORDER — ALBUTEROL SULFATE HFA 108 (90 BASE) MCG/ACT IN AERS
2.0000 | INHALATION_SPRAY | Freq: Four times a day (QID) | RESPIRATORY_TRACT | Status: DC
  Administered 2020-07-28 – 2020-08-11 (×54): 2 via RESPIRATORY_TRACT
  Filled 2020-07-28 (×5): qty 6.7

## 2020-07-28 MED ORDER — ONDANSETRON HCL 4 MG/2ML IJ SOLN: 4.0000 mg | Freq: Four times a day (QID) | INTRAMUSCULAR | Status: DC | PRN

## 2020-07-28 MED ORDER — METRONIDAZOLE IN NACL 5-0.79 MG/ML-% IV SOLN
500.0000 mg | Freq: Three times a day (TID) | INTRAVENOUS | Status: DC
  Administered 2020-07-28: 500 mg via INTRAVENOUS
  Filled 2020-07-28: qty 100

## 2020-07-28 MED ORDER — SODIUM CHLORIDE 0.9 % IV BOLUS
1000.0000 mL | Freq: Once | INTRAVENOUS | Status: AC
  Administered 2020-07-28: 1000 mL via INTRAVENOUS

## 2020-07-28 MED ORDER — SODIUM CHLORIDE 0.9 % IV SOLN: 500.0000 mg | INTRAVENOUS | Status: DC

## 2020-07-28 MED ORDER — SODIUM CHLORIDE 0.9 % IV SOLN
500.0000 mg | INTRAVENOUS | Status: DC
  Administered 2020-07-28 – 2020-07-29 (×2): 500 mg via INTRAVENOUS
  Filled 2020-07-28 (×3): qty 500

## 2020-07-28 MED ORDER — KCL-LACTATED RINGERS-D5W 20 MEQ/L IV SOLN
INTRAVENOUS | Status: DC
  Filled 2020-07-28 (×2): qty 1000

## 2020-07-28 MED ORDER — SODIUM CHLORIDE 0.9 % IV SOLN: 200.0000 mg | Freq: Once | INTRAVENOUS | Status: DC

## 2020-07-28 MED ORDER — INSULIN REGULAR(HUMAN) IN NACL 100-0.9 UT/100ML-% IV SOLN: INTRAVENOUS | Status: DC

## 2020-07-28 MED ORDER — DEXAMETHASONE SODIUM PHOSPHATE 10 MG/ML IJ SOLN
6.0000 mg | INTRAMUSCULAR | Status: DC
  Administered 2020-07-28 – 2020-08-01 (×5): 6 mg via INTRAVENOUS
  Filled 2020-07-28 (×5): qty 1

## 2020-07-28 MED ORDER — POLYETHYLENE GLYCOL 3350 17 G PO PACK: 17.0000 g | PACK | Freq: Every day | ORAL | Status: DC | PRN

## 2020-07-28 NOTE — H&P (Signed)
History and Physical    Todd Farrell ZOX:[id number] DOB: 10-Jun-1956 DOA: 2020/08/06  PCP: Patient, No Pcp Per  Patient coming from: Home via EMS   Chief Complaint:  Chief Complaint  Patient presents with   Altered Mental Status   Hyperglycemia     HPI:    64 year old male with past medical history of diabetes mellitus type 2, hyperlipidemia, vitamin D deficiency, benign prostatic hyperplasia, chronic kidney disease stage IIIb who presents to [facility] emergency department via EMS after they were concerned by family that patient was becoming confused.  Patient is an extremely poor historian due to severe lethargy.  Unfortunately, we do not have any contact information that is accurate for family that the patient actually lives with.  According to the emergency department staff, EMS found the patient to be hypothermic and hypoxic.  Patient was initially placed on nonrebreather mask for oxygen supplementation and brought to [facility] for evaluation.  Upon evaluation in the emergency department patient was found to have multiple SIRS criteria including tachycardia and leukocytosis with severe lactic acidosis and evidence of DKA.  Patient was initiated on intravenous volume resuscitation as well as an insulin drip.  Patient was placed on broad-spectrum intravenous antibiotics.  Covid PCR testing did come back positive with evidence of left lower lobe infiltrate.  The hospitalist group was then called to assess the patient for admission to the hospital.   Review of Systems:   Unable to obtain review of systems due to patient's altered mentation.   Past Medical History:  Diagnosis Date   BPH (benign prostatic hyperplasia) 07/28/2020   Mixed diabetic hyperlipidemia associated with type 2 diabetes mellitus (HCC) 07/28/2020   Vitamin D deficiency 07/28/2020   Unable to obtain social history and family history due to altered mentation.  Prior to Admission medications     Not on File    Physical Exam: Vitals:   07/28/20 0543 07/28/20 0545 07/28/20 0629 07/28/20 0630  BP: 115/72 114/78 (!) 139/93 (!) 141/77  Pulse:  (!) 103 100   Resp: (!) 33 (!) 33 (!) 32 (!) 28  Temp:      TempSrc:      SpO2:  93% 93%   Weight:      Height:        Constitutional: Patient is lethargic but arousable and oriented x1.  Patient is currently not in any acute distress.  Patient is cachectic. Skin: no rashes, no lesions, extremely poor skin turgor noted. Eyes: Pupils are equally reactive to light.  No evidence of scleral icterus or conjunctival pallor.  ENMT: Moist mucous membranes noted.  Posterior pharynx clear of any exudate or lesions.   Neck: normal, supple, no masses, no thyromegaly.  No evidence of jugular venous distension.   Respiratory: clear to auscultation bilaterally, no wheezing, no crackles. Normal respiratory effort. No accessory muscle use.  Cardiovascular: Tachycardic rate, regular rhythm.  No murmurs / rubs / gallops. No extremity edema. 2+ pedal pulses. No carotid bruits.  Chest:   Nontender without crepitus or deformity.   Back:   Nontender without crepitus or deformity. Abdomen: Abdomen is soft and nontender.  No evidence of intra-abdominal masses.  Positive bowel sounds noted in all quadrants.   Musculoskeletal: No joint deformity upper and lower extremities. Good ROM, no contractures. Normal muscle tone.  Neurologic: Patient is lethargic but arousable and oriented x1.  Patient is not consistently following commands.  Patient is able to move all 4 extremities spontaneously.  Patient is  responsive to verbal and painful stimuli.  Psychiatric: Unable to assess due to significant lethargy.  Patient currently does not seem to possess insight as to his current situation.   Labs on Admission: I have personally reviewed following labs and imaging studies -   CBC: Recent Labs  Lab 08/06/2020 2329 07/28/20 0205  WBC 15.9*  --   NEUTROABS 14.6*  --   HGB  18.2* 16.3  HCT 59.2* 48.0  MCV 91.4  --   PLT 360  --    Basic Metabolic Panel: Recent Labs  Lab 07/20/2020 2329 07/28/20 0205 07/28/20 0536  NA 135 136 142  K 6.2* 4.1 3.1*  CL 85*  --  101  CO2 11*  --  24  GLUCOSE 788*  --  303*  BUN 131*  --  108*  CREATININE 4.38*  --  3.18*  CALCIUM 8.7*  --  6.9*   GFR: Estimated Creatinine Clearance: 14.5 mL/min (A) (by C-G formula based on SCr of 3.18 mg/dL (H)). Liver Function Tests: Recent Labs  Lab 07/28/2020 2329  AST 47*  ALT 15  ALKPHOS 83  BILITOT 3.2*  PROT 8.3*  ALBUMIN 2.4*   No results for input(s): LIPASE, AMYLASE in the last 168 hours. No results for input(s): AMMONIA in the last 168 hours. Coagulation Profile: Recent Labs  Lab 07/28/20 0117  INR 1.1   Cardiac Enzymes: No results for input(s): CKTOTAL, CKMB, CKMBINDEX, TROPONINI in the last 168 hours. BNP (last 3 results) No results for input(s): PROBNP in the last 8760 hours. HbA1C: No results for input(s): HGBA1C in the last 72 hours. CBG: Recent Labs  Lab 08/06/2020 2243 07/28/20 0202 07/28/20 0321 07/28/20 0500 07/28/20 0619  GLUCAP >600* 497* 465* 326* 220*   Lipid Profile: No results for input(s): CHOL, HDL, LDLCALC, TRIG, CHOLHDL, LDLDIRECT in the last 72 hours. Thyroid Function Tests: No results for input(s): TSH, T4TOTAL, FREET4, T3FREE, THYROIDAB in the last 72 hours. Anemia Panel: No results for input(s): VITAMINB12, FOLATE, FERRITIN, TIBC, IRON, RETICCTPCT in the last 72 hours. Urine analysis: No results found for: COLORURINE, APPEARANCEUR, LABSPEC, PHURINE, GLUCOSEU, HGBUR, BILIRUBINUR, KETONESUR, PROTEINUR, UROBILINOGEN, NITRITE, LEUKOCYTESUR  Radiological Exams on Admission - Personally Reviewed: DG Chest Port 1 View  Result Date: 07/31/2020 CLINICAL DATA:  Shortness of breath EXAM: PORTABLE CHEST 1 VIEW COMPARISON:  01/27/2014 FINDINGS: Low lung volumes. Left basilar airspace opacity, new since prior study. Heart is normal size.  Right lung clear. No effusions or acute bony abnormality. IMPRESSION: Low lung volumes. Left basilar airspace opacity concerning for pneumonia. Electronically Signed   By: Charlett Nose M.D.   On: 07/17/2020 23:03    Telemetry: Personally reviewed.  Rhythm is sinus tachycardia 110 bpm.  Assessment/Plan Principal Problem:   Sepsis due to COVID-19 [facility])   Patient presenting with multiple sirs criteria including tachycardia, hypothermia and leukocytosis with severe lactic acidosis, metabolic encephalopathy, acute kidney injury and acute hypoxic respiratory failure all thought to be secondary to COVID-19 infection with left lower lobe pneumonia.  Placing patient on intravenous Decadron and remdesivir  For now, due to severity of illness concurrently treating patient with intravenous ceftriaxone and azithromycin which can be quickly discontinued if patient's procalcitonin is unremarkable.  Hydrating patient aggressively with intravenous isotonic fluids especially considering the fact that patient is also suffering from DKA.  Blood cultures have been obtained.  Urinalysis and urine cultures have been ordered.  Providing patient with supplemental oxygen  Admitting to COVID-19 progressive bed.  Active Problems:  Diabetic ketoacidosis without coma associated with type 2 diabetes mellitus (HCC)   Patient presenting with severe hyperglycemia, significant anion gap acidosis and markedly elevated beta hydroxybutyrate concerning for diabetic ketoacidosis  VBG performed in the emergency department reveals a reassuring pH.  Patient is placed on insulin infusion per protocol  Hydrating patient aggressively with intravenous isotonic fluids  Serial chemistries every 4 hours  Patient currently n.p.o., diet will be initiated once gap is closed and basal insulin is initiated.    Lactic acidosis   Patient presenting with severe lactic acidosis thought to be secondary to volume depletion and  severe sepsis  Hydrating patient aggressively with intravenous isotonic fluids, treating underlying infection with intravenous antibiotics  Performing serial lactic acid levels to ensure downtrending and resolution.    Acute respiratory failure with hypoxia [facility](HCC)   Patient presenting with evidence of hypoxia requiring significant oxygen supplementation concerning for acute hypoxic respiratory failure  This is likely secondary to underlying COVID-19  Treating underlying cause of respiratory failure with intravenous Decadron, remdesivir and also antibacterials for now  Providing patient with as needed bronchodilator therapy via MDI  Weaning supplemental oxygen as tolerated.    Acute renal failure superimposed on stage 3b chronic kidney disease (HCC)   Patient suffering from substantial acute kidney injury superimposed on chronic kidney disease  Based on review of care everywhere records, patient has had a baseline creatinine in the past of 2.4.  Hydrating patient with intravenous fluids  Strict input and output monitoring  Monitoring renal function electrolytes with serial chemistries  Minimizing nephrotoxic agents as much as possible.    Acute metabolic encephalopathy   Patient presenting with significant lethargy and inability to participate with providing history or participating with examination.  This is thought to be secondary to encephalopathy due to underlying infection and volume depletion  Treating underlying infection and hydrating patient aggressively, will monitor for symptomatic improvement.  If patient fails to clinically improve will expand work-up of encephalopathy.    Hyperkalemia, diminished renal excretion   Patient presenting with hyperkalemia of 6.2  Potassium shifting cocktail has already been ordered by the emergency department provider.  Monitoring patient on telemetry  Performing serial chemistries to ensure downtrending of  potassium.  Etiology is likely secondary to renal injury.    Severe protein-calorie malnutrition (HCC)  Patient presenting with cachexia and markedly low BMI of 14.88  Patient likely suffering from severe protein calorie malnutrition.  Patient is currently n.p.o. due to DKA will likely need to be placed on nutritional supplements going forward.  Nutrition consultation placed, their advice is appreciated.    Code Status:  Full code Family Communication: Unable to contact any family at place of residence.  Status is: Inpatient  Remains inpatient appropriate because:Persistent severe electrolyte disturbances, Altered mental status, Ongoing diagnostic testing needed not appropriate for outpatient work up, IV treatments appropriate due to intensity of illness or inability to take PO and Inpatient level of care appropriate due to severity of illness   Dispo: The patient is from: Home              Anticipated d/c is to: Home              Anticipated d/c date is: > 3 days              Patient currently is not medically stable to d/c.        Marinda ElkGeorge J Brinlyn Cena MD [facility] Pager [phone]- [phone]  If 7PM-7AM, please contact night-coverage  [email or web address] Use universal [place] password for that web site. If you do not have the password, please call the hospital operator.  07/28/2020, 6:57 AM

## 2020-07-28 NOTE — ED Notes (Signed)
Pt incontinent of urine and stool. Male external catheter slide out of place. Pt cleaned and linens changed.

## 2020-07-28 NOTE — ED Notes (Signed)
Jasmine-- [phone]

## 2020-07-28 NOTE — ED Notes (Signed)
RT at bedside, attempted ABG collection without success. RN messaged PA to notify.

## 2020-07-28 NOTE — ED Notes (Signed)
Pt rectal temp. 98.9. Bear hugger removed, pt under warm blankets.

## 2020-07-28 NOTE — Progress Notes (Signed)
Secure chat Dr Allena Katz and primary RN re Midline order. Made aware that midline is not appropriate for the amount of medicine patient is getting right now. Patient has PIV x3 as charted in the flowsheets. Will follow up.

## 2020-07-28 NOTE — Progress Notes (Signed)
PHARMACY - PHYSICIAN COMMUNICATION CRITICAL VALUE ALERT - BLOOD CULTURE IDENTIFICATION (BCID)  BCID resulted in positive blood culture growing e.coli.  Urine culture sent.  Patient still admitted to [facility] when call received.  Name of physician (or Provider) Contacted: Patel  Changes to prescribed antibiotics required: Patient was already de-escalated to ceftriaxone earlier today. Primary team mentions respiratory symptoms (covid) so will continue azith for now.    Sheppard Coil PharmD., BCPS Clinical Pharmacist 07/28/2020 7:32 PM

## 2020-07-28 NOTE — ED Notes (Signed)
Microbiology called advised patient is Covid positive--Todd Farrell

## 2020-07-28 NOTE — ED Notes (Signed)
Spoke with sister, Owens Loffler -- [phone]-- states that pt's children would not let her in to see him last week. She is concerned that they are not taking care of him-- she says that here is at least 10-12 people living in the apt that her brother was in-- they are all complaining that they have a stomach bug. They would not seek care for pt for at least a week, stating their religion was preventing them from having him COVID tested or treated.  Owens Loffler -[phone] Kizzie Fantasia (father) [phone]

## 2020-07-28 NOTE — ED Notes (Signed)
RN notified Dr. Leafy Half about pt decreased potassium level. New order to be placed.

## 2020-07-28 NOTE — Progress Notes (Signed)
RT attempted ABG but was unsuccessful. RN aware.

## 2020-07-28 NOTE — ED Notes (Signed)
Placed on hospital inpt bed, mepilex applied to coocyx area-- no breakdown apparent. Great toes on both feet are black, not draining anything, states they have been like thus for "A few months"

## 2020-07-28 NOTE — ED Notes (Addendum)
Daughter - Leavy Cella on phone, wanted update on condition. This nurse told her of covid positivity- and isolation. Pt had given permissions to talk with this family member also. Explained to [facility] that pt is very ill and is not able to have visitors. Her concern is his toes, wants to know when they will be taken care of.

## 2020-07-28 NOTE — Progress Notes (Signed)
Inpatient Diabetes Program Recommendations  AACE/ADA: New Consensus Statement on Inpatient Glycemic Control (2015)  Target Ranges:  Prepandial:   less than 140 mg/dL      Peak postprandial:   less than 180 mg/dL (1-2 hours)      Critically ill patients:  140 - 180 mg/dL   Lab Results  Component Value Date   GLUCAP 199 (H) 07/28/2020    Review of Glycemic Control Results for Todd Farrell, Todd Farrell (MRN [id number]) as of 07/28/2020 08:54  Ref. Range 07/28/2020 05:36  Sodium Latest Ref Range: 135 - 145 mmol/L 142  Potassium Latest Ref Range: 3.5 - 5.1 mmol/L 3.1 (L)  Chloride Latest Ref Range: 98 - 111 mmol/L 101  CO2 Latest Ref Range: 22 - 32 mmol/L 24  Glucose Latest Ref Range: 70 - 99 mg/dL 725 (H)  BUN Latest Ref Range: 8 - 23 mg/dL 366 (H)  Creatinine Latest Ref Range: 0.61 - 1.24 mg/dL 4.40 (H)  Calcium Latest Ref Range: 8.9 - 10.3 mg/dL 6.9 (L)  Anion gap Latest Ref Range: 5 - 15  17 (H)   Diabetes history: DM 2 Outpatient Diabetes medications: on insulin unsure type and dose at this time Current orders for Inpatient glycemic control:  IV insulin/Endotool  A1c in process  Inpatient Diabetes Program Recommendations:    Note CO2 within normal waiting current BMET results and Beta hydroxybutyric acid. Gap elevated still.  At time of transition utilize the COVID glycemic control order set dosing insulin based on last insulin gtt rate.  Also consider Tradjenta 5 mg Daily  Thanks,  Christena Deem RN, MSN, BC-ADM Inpatient Diabetes Coordinator Team Pager [phone] (8a-5p)

## 2020-07-28 NOTE — ED Notes (Signed)
RN informed that MD spoke to pt in regards to covid swab, RN sending swab to lab. Results not to be dicussed with pt.

## 2020-07-28 NOTE — Progress Notes (Signed)
[facility] PLAN OF CARE NOTE Patient: Todd Farrell BVQ:[id number]   PCP: Patient, No Pcp Per DOB: 1956/02/06   DOA: 08/09/2020   DOS: 07/28/2020    Patient was admitted by my colleague earlier on 07/28/2020. I have reviewed the H&P as well as assessment and plan and agree with the same. Important changes in the plan are listed below.  Plan of care: Principal Problem:   Sepsis due to COVID-19 [facility]) Active Problems:   Diabetic ketoacidosis without coma associated with type 2 diabetes mellitus (HCC)   Lactic acidosis   Acute respiratory failure with hypoxia (HCC)   Acute renal failure superimposed on stage 3b chronic kidney disease (HCC)   Acute metabolic encephalopathy   Hyperkalemia, diminished renal excretion   Severe protein-calorie malnutrition (HCC) DKA resolved. Transition to basal bolus regimen. Every 4 hours due to patient's poor p.o. intake. Speech therapy consulted for mentation changes. CT head as well as encephalopathy work-up ordered. BC ID positive for E. coli currently on IV ceftriaxone continue.   Author: Lynden Oxford, MD [facility] 07/28/2020 7:54 PM   If 7PM-7AM, please contact night-coverage at [email or web address]

## 2020-07-28 NOTE — ED Notes (Signed)
RN informed Dr. Martyn Malay of increased potassium.

## 2020-07-29 ENCOUNTER — Other Ambulatory Visit: Payer: Self-pay

## 2020-07-29 DIAGNOSIS — U071 COVID-19: Secondary | ICD-10-CM | POA: Diagnosis not present

## 2020-07-29 DIAGNOSIS — A4189 Other specified sepsis: Secondary | ICD-10-CM | POA: Diagnosis not present

## 2020-07-29 LAB — BASIC METABOLIC PANEL
Anion gap: 13 (ref 5–15)
BUN: 90 mg/dL — ABNORMAL HIGH (ref 8–23)
CO2: 27 mmol/L (ref 22–32)
Calcium: 8 mg/dL — ABNORMAL LOW (ref 8.9–10.3)
Chloride: 105 mmol/L (ref 98–111)
Creatinine, Ser: 2.56 mg/dL — ABNORMAL HIGH (ref 0.61–1.24)
GFR calc Af Amer: 30 mL/min — ABNORMAL LOW (ref >60)
GFR calc non Af Amer: 26 mL/min — ABNORMAL LOW (ref >60)
Glucose, Bld: 296 mg/dL — ABNORMAL HIGH (ref 70–99)
Potassium: 4.1 mmol/L (ref 3.5–5.1)
Sodium: 145 mmol/L (ref 135–145)

## 2020-07-29 LAB — URINE CULTURE: Culture: <10000 — AB

## 2020-07-29 LAB — RPR
RPR Ser Ql: REACTIVE — AB
RPR Titer: 1:1 {titer}

## 2020-07-29 LAB — BETA-HYDROXYBUTYRIC ACID: Beta-Hydroxybutyric Acid: 1.39 mmol/L — ABNORMAL HIGH (ref 0.05–0.27)

## 2020-07-29 LAB — CBG MONITORING, ED
Glucose-Capillary: 251 mg/dL — ABNORMAL HIGH (ref 70–99)
Glucose-Capillary: 181 mg/dL — ABNORMAL HIGH (ref 70–99)
Glucose-Capillary: 102 mg/dL — ABNORMAL HIGH (ref 70–99)
Glucose-Capillary: 91 mg/dL (ref 70–99)

## 2020-07-29 LAB — HEMOGLOBIN A1C
Hgb A1c MFr Bld: 14 % — ABNORMAL HIGH (ref 4.8–5.6)
Mean Plasma Glucose: 355 mg/dL

## 2020-07-29 LAB — D-DIMER, QUANTITATIVE: D-Dimer, Quant: 0.99 ug/mL-FEU — ABNORMAL HIGH (ref 0.00–0.50)

## 2020-07-29 MED ORDER — INSULIN ASPART 100 UNIT/ML ~~LOC~~ SOLN
0.0000 [IU] | SUBCUTANEOUS | Status: DC
  Administered 2020-07-29: 2 [IU] via SUBCUTANEOUS
  Administered 2020-07-30: 11 [IU] via SUBCUTANEOUS
  Administered 2020-07-30: 2 [IU] via SUBCUTANEOUS
  Administered 2020-07-30: 8 [IU] via SUBCUTANEOUS
  Administered 2020-07-30: 2 [IU] via SUBCUTANEOUS

## 2020-07-29 MED ORDER — INSULIN DETEMIR 100 UNIT/ML ~~LOC~~ SOLN
0.1000 [IU]/kg | Freq: Two times a day (BID) | SUBCUTANEOUS | Status: DC
  Administered 2020-07-29 – 2020-07-30 (×4): 4 [IU] via SUBCUTANEOUS
  Filled 2020-07-29 (×5): qty 0.04

## 2020-07-29 MED ORDER — LINAGLIPTIN 5 MG PO TABS
5.0000 mg | ORAL_TABLET | Freq: Every day | ORAL | Status: DC
  Administered 2020-07-29 – 2020-07-31 (×3): 5 mg via ORAL
  Filled 2020-07-29 (×3): qty 1

## 2020-07-29 MED ORDER — LACTATED RINGERS IV SOLN: INTRAVENOUS | Status: DC

## 2020-07-29 NOTE — Progress Notes (Signed)
[facility] Progress Note  Patient: Todd Farrell    HYI:[id number]  DOA: 08/14/2020     Date of Service: the patient was seen and examined on 07/29/2020  Brief hospital course: diabetes mellitus type 2, hyperlipidemia, vitamin D deficiency, benign prostatic hyperplasia, chronic kidney disease stage IIIb  Presents with DKA, sepsis secondary to UTI and pneumonia as well as COVID-19. Currently plan is continue antibiotics.  Assessment and Plan: 1.  Sepsis due to combined community-acquired pneumonia as well as UTI and bacteremia with E. coli. Also secondary to COVID-19. POA. Met SIRS criteria with lactic acidosis, encephalopathy, tachycardia, hypothermia as well as leukocytosis. Treated aggressively with IV fluids. Currently sepsis physiology has resolved. Lactic acidosis has resolved as well. Blood cultures positive for E. coli. Continuing IV ceftriaxone. CT shows evidence of sinusitis as well as patient has pneumonia on the chest x-ray therefore we will continue azithromycin. Monitor response to treatment.  2.  Acute metabolic encephalopathy In the setting of sepsis and DKA. Currently getting better. Initially was NPO. Currently will initiate dysphagia 1 diet. Speech therapy consulted. CT scan negative for any acute stroke. Metabolic work-up unremarkable  3.  DKA Uncontrolled type 2 diabetes mellitus with hyperglycemia with renal complication Presented with hyperglycemia, anion gap acidosis as well as elevated beta hydroxybutyric acid. Treated with IV insulin. Currently on basal bolus regimen. Monitor.  4.  Acute COVID-19 Viral Pneumonia Lab Results  Component Value Date   SARSCOV2NAA POSITIVE (A) 07/17/2020   CXR: hazy bilateral peripheral opacities CT chest: GGO, consolidation Recent Labs    07/28/20 1701 07/29/20 0258  DDIMER  --  0.99*  CRP 25.8*  --    Tmax last 24 hours:  Temp (24hrs), Avg:97.7 F (36.5 C), Min:97.7 F (36.5 C), Max:97.7 F (36.5  C)  Oxygen requirements: 3 LPM.  Antibiotics: Ceftriaxone and azithromycin for UTI and pneumonia Vitamin C and Zinc: Currently n.p.o. DVT Prophylaxis: heparin injection 5,000 Units Start: 07/28/20 1400 Remdesivir: Started on 07/28/2020 Steroids: Decadron started on 07/20/2020 Baricitinib/Actemra(off-label use): Not a candidate due to sepsis and bacteremia Prone positioning: Patient encouraged to stay in prone position as much as possible.  The treatment plan and use of medications and known side effects were discussed with patient/family. It was clearly explained that there is no proven definitive treatment for COVID-19 infection yet. Complete risks and long-term side effects are unknown, however in the best clinical judgment they seem to be of some clinical benefit rather than medical risks. Patient/family agree with the treatment plan and want to receive these treatments as indicated.   5.  Hyperkalemia Acute kidney injury on chronic kidney disease stage IIIb Renal function actually improving. Continue with IV hydration.  Monitor.  Potassium also improving.  Monitor.  6.  Severe protein calorie malnutrition Failure to thrive Body mass index is 14.88 kg/m.  Dietary consulted. Monitor. At risk for poor outcome.  7.  Bilateral great toe ulceration Suspect in the setting of PVD. Check ABI. Currently on antibiotic. Presents with an acutely infected.  8.  Poor IV access. Difficult to obtain labs. Currently monitoring closely.  Patient has a 20-gauge IV.  Diet: Dysphagia 1 diet DVT Prophylaxis:   heparin injection 5,000 Units Start: 07/28/20 1400    Advance goals of care discussion: Full code  Family Communication: no family was present at bedside, at the time of interview.  The pt provided permission to discuss medical plan with the family.  Discussed with patient's daughter per patient's request. opportunity was given to ask question  and all questions were answered  satisfactorily.   Disposition:  Status is: Inpatient  Remains inpatient appropriate because:IV treatments appropriate due to intensity of illness or inability to take PO   Dispo: The patient is from: Home              Anticipated d/c is to: SNF              Anticipated d/c date is: 3 days              Patient currently is not medically stable to d/c.        Subjective: No nausea no vomiting.  No fever no chills.  Denies any confusion denies any chest pain.  Physical Exam:  General: Appear in mild distress, no Rash; Oral Mucosa Clear, moist. no Abnormal Neck Mass Or lumps, Conjunctiva normal  Cardiovascular: S1 and S2 Present, no Murmur, Respiratory: good respiratory effort, Bilateral Air entry present and bilateral  Crackles, no wheezes Abdomen: Bowel Sound present, Soft and no tenderness Extremities: no Pedal edema, no calf tenderness Neurology: alert and oriented to time, place, and person affect appropriate. no new focal deficit Gait not checked due to patient safety concerns  Vitals:   07/29/20 1635 07/29/20 1730 07/29/20 1800 07/29/20 1900  BP:  (!) 144/82 (!) 133/107 130/86  Pulse:  78 77 81  Resp:  (!) 30 (!) 27 (!) 28  Temp: 97.7 F (36.5 C)     TempSrc: Rectal     SpO2:  91% 93% 92%  Weight:      Height:        Intake/Output Summary (Last 24 hours) at 07/29/2020 1922 Last data filed at 07/29/2020 1602 Gross per 24 hour  Intake --  Output 1200 ml  Net -1200 ml   Filed Weights   07/28/20 0012  Weight: 43.1 kg    Data Reviewed: I have personally reviewed and interpreted daily labs, tele strips, imagings as discussed above. I reviewed all nursing notes, pharmacy notes, vitals, pertinent old records I have discussed plan of care as described above with RN and patient/family.  CBC: Recent Labs  Lab 07/21/2020 2329 07/28/20 0205 07/28/20 1701  WBC 15.9*  --  17.8*  NEUTROABS 14.6*  --  16.8*  HGB 18.2* 16.3 14.8  HCT 59.2* 48.0 45.8  MCV 91.4  --   86.1  PLT 360  --  622   Basic Metabolic Panel: Recent Labs  Lab 08/06/2020 2329 07/23/2020 2329 07/28/20 0205 07/28/20 0536 07/28/20 1701 07/28/20 2212 07/29/20 0226  NA 135   < > 136 142 145 145 145  K 6.2*   < > 4.1 3.1* 4.2 3.8 4.1  CL 85*  --   --  101 105 116* 105  CO2 11*  --   --  24 28 20* 27  GLUCOSE 788*  --   --  303* 109* 300* 296*  BUN 131*  --   --  108* 95* 71* 90*  CREATININE 4.38*  --   --  3.18* 2.75* 1.93* 2.56*  CALCIUM 8.7*  --   --  6.9* 8.0* 5.5* 8.0*   < > = values in this interval not displayed.    Studies: No results found.  Scheduled Meds: . albuterol  2 puff Inhalation Q6H  . dexamethasone (DECADRON) injection  6 mg Intravenous Q24H  . heparin  5,000 Units Subcutaneous Q8H  . insulin aspart  0-15 Units Subcutaneous Q4H  . insulin detemir  0.1 Units/kg Subcutaneous BID  .  linagliptin  5 mg Oral Daily   Continuous Infusions: . azithromycin Stopped (07/29/20 1203)  . cefTRIAXone (ROCEPHIN)  IV Stopped (07/29/20 1052)  . lactated ringers 125 mL/hr at 07/29/20 1354  . remdesivir 100 mg in NS 100 mL Stopped (07/29/20 1052)   PRN Meds: acetaminophen, ondansetron **OR** ondansetron (ZOFRAN) IV, polyethylene glycol  Time spent: 35 minutes  Author: Berle Mull, MD [facility] 07/29/2020 7:22 PM  To reach On-call, see care teams to locate the attending and reach out via [email or web address]. Between 7PM-7AM, please contact night-coverage If you still have difficulty reaching the attending provider, please page the [facility] (Director on Call) for [facility] on amion for assistance.

## 2020-07-29 NOTE — ED Notes (Signed)
Patient used Gaffer. Pt was instructed to attempt using it during commercials. Pt understood teaching.

## 2020-07-29 NOTE — ED Notes (Signed)
Rachelle (Sister#[phone]) called for a status on her brother condition.

## 2020-07-29 NOTE — ED Notes (Signed)
Pt removed [place], de sat to 84%. [place] reapplied. O2 bumped up to 4L [place]. O2 reading 93%.

## 2020-07-29 NOTE — ED Notes (Signed)
Pt left and right great toe are dry and necrotic. Nail bed in right great toe is missing. Patient states they are painful when touched.

## 2020-07-29 NOTE — ED Notes (Signed)
Call the sister Boykin Reaper at [phone] she states she called earlier and has not gotten a call back

## 2020-07-30 ENCOUNTER — Encounter (HOSPITAL_COMMUNITY): Payer: Self-pay | Admitting: Internal Medicine

## 2020-07-30 DIAGNOSIS — R9431 Abnormal electrocardiogram [ECG] [EKG]: Secondary | ICD-10-CM | POA: Diagnosis present

## 2020-07-30 DIAGNOSIS — U071 COVID-19: Secondary | ICD-10-CM | POA: Diagnosis not present

## 2020-07-30 DIAGNOSIS — A4189 Other specified sepsis: Secondary | ICD-10-CM | POA: Diagnosis not present

## 2020-07-30 LAB — GLUCOSE, CAPILLARY
Glucose-Capillary: 128 mg/dL — ABNORMAL HIGH (ref 70–99)
Glucose-Capillary: 144 mg/dL — ABNORMAL HIGH (ref 70–99)
Glucose-Capillary: 148 mg/dL — ABNORMAL HIGH (ref 70–99)
Glucose-Capillary: 274 mg/dL — ABNORMAL HIGH (ref 70–99)
Glucose-Capillary: 308 mg/dL — ABNORMAL HIGH (ref 70–99)
Glucose-Capillary: 87 mg/dL (ref 70–99)

## 2020-07-30 LAB — CULTURE, BLOOD (ROUTINE X 2)

## 2020-07-30 LAB — MRSA PCR SCREENING: MRSA by PCR: NEGATIVE

## 2020-07-30 MED ORDER — SODIUM CHLORIDE 0.9 % IV SOLN
100.0000 mg | Freq: Two times a day (BID) | INTRAVENOUS | Status: DC
  Administered 2020-07-30 – 2020-07-31 (×4): 100 mg via INTRAVENOUS
  Filled 2020-07-30 (×6): qty 100

## 2020-07-30 MED ORDER — ADULT MULTIVITAMIN W/MINERALS CH
1.0000 | ORAL_TABLET | Freq: Every day | ORAL | Status: DC
  Administered 2020-07-30 – 2020-08-05 (×7): 1 via ORAL
  Filled 2020-07-30 (×7): qty 1

## 2020-07-30 MED ORDER — NEPRO/CARBSTEADY PO LIQD
237.0000 mL | Freq: Two times a day (BID) | ORAL | Status: DC
  Administered 2020-07-30 – 2020-08-03 (×8): 237 mL via ORAL

## 2020-07-30 MED ORDER — ENSURE ENLIVE PO LIQD
237.0000 mL | Freq: Two times a day (BID) | ORAL | Status: DC
  Administered 2020-07-30: 237 mL via ORAL

## 2020-07-30 MED ORDER — LORAZEPAM 0.5 MG PO TABS
0.5000 mg | ORAL_TABLET | Freq: Once | ORAL | Status: AC
  Administered 2020-07-30: 0.5 mg via ORAL
  Filled 2020-07-30: qty 1

## 2020-07-30 NOTE — Progress Notes (Signed)
Notified Dr. Antionette Char of reported prolonged QTc.  EKG ordered and done. Dr.Opyd paged that resulted EKG was in the chart.  No new orders given.  Will continue to monitor.

## 2020-07-30 NOTE — Progress Notes (Signed)
Todd Farrell [id number] Admitted to 4Z66: 07/30/2020 12:30 AM Attending Provider: Rolly Salter, MD    Todd Farrell is a 64 y.o. male patient admitted from ED awake, alert  & orientated  X 3,  Full Code, VSS - Blood pressure (!) 154/81, pulse 92, temperature 98 F (36.7 C), temperature source Oral, resp. rate (!) 21, height 5\' 7"  (1.702 m), weight 43.1 kg, SpO2 97 %., O2  L nasal cannular, no c/o shortness of breath, no c/o chest pain, no distress noted. Tele placed and pt is currently running:Sinus Rhythm   IV site WDL:  with a transparent dsg that's clean dry and intact.  Allergies:  No Known Allergies   Past Medical History:  Diagnosis Date  . BPH (benign prostatic hyperplasia) 07/28/2020  . Mixed diabetic hyperlipidemia associated with type 2 diabetes mellitus (HCC) 07/28/2020  . Vitamin D deficiency 07/28/2020    History:  obtained from patient  Pt orientation to unit, room and routine. Information packet given to patient/family.  Admission INP armband ID verified with patient, and in place. SR up x 2, fall risk assessment complete with Patient verbalizing understanding of risks associated with falls. Pt verbalizes an understanding of how to use the call bell and to call for help before getting out of bed.  Skin, see flowsheet.    Will cont to monitor and assist as needed.  09/27/2020, RN 07/30/2020 12:30 AM

## 2020-07-30 NOTE — Progress Notes (Signed)
Upon assessment, patient on phone with his sister and is visably upset by conversation. Patient's sister informed RN that his ex wife had passed away today. Pt's sister asked if we could possibly give pt ativan or something else to "calm him down." Dr. Allena Katz paged. One time dose of ativan placed. Will continue to monitor.

## 2020-07-30 NOTE — Progress Notes (Signed)
Initial Nutrition Assessment  DOCUMENTATION CODES:   Underweight (suspect severe PCM)  INTERVENTION:  Nepro Shake po BID, each supplement provides 425 kcal and 19 grams protein Vital Cuisine po daily on breakfast tray, each supplement provides 520 kcal and 22 grams of protein  MVI with minerals daily   NUTRITION DIAGNOSIS:   Increased nutrient needs related to catabolic illness (pneumonia secondary COVID-19 virus infection) as evidenced by estimated needs.   GOAL:   Patient will meet greater than or equal to 90% of their needs   MONITOR:   Labs, I & O's, Skin, Diet advancement, PO intake, Weight trends, Supplement acceptance  REASON FOR ASSESSMENT:   Malnutrition Screening Tool, Consult Assessment of nutrition requirement/status  ASSESSMENT:  RD working remotely.  64 year old male with past medical history of DM2, HLD, vitamin D deficiency, benign prostatic hyperplasia, CKD stage IIIb admitted with sepsis secondary to UTI and pneumonia due to COVID-19 virus infection presented with AMS, severe lethargy and found to be hypothermic and hypoxic.  RD attempted to reach pt via phone this morning, however was disconnected and then received a busy signal x 2 additional attempts, unable to obtain nutrition history at this time. Patient diet advanced to Dysphagia 1: Nectar Thick liquids, will discontinue Ensure and order Nepro BID as well as Vital Cuisine supplement on breakfast tray to aid with meeting needs. SLP evaluation is currently pending. Will continue to monitor for diet advancement.  Current wt 94.82 lb No past weight history for review. Given pt  is severely underweight, highly suspect malnutrition however unable to identify at this time.   Per notes: -sepsis physiology resolved -blood cultures positive for E. coli -acutely infected bilateral toe ulceration present on admit -poor IV access, difficult to obtain labs -hyperkalemia improving -acute metabolic  encephalopathy improving  I/Os: +1700 ml since admit    -200 ml x 24 hrs UOP: 1200 ml x 24hrs Medications reviewed and include: Decadron, SSI, Levemir 4 units twice daily, Tradjenta IVPB: Rocephin, Doxycycline, Remdesivir IVF: Lactated ringers @ 125 ml/hr  Labs: CBGs 87,128,148,91,102 8/13: BUN 90 (H), Cr 2.56 (H) Lab Results  Component Value Date   HGBA1C 14.0 (H) 07/28/2020     NUTRITION - FOCUSED PHYSICAL EXAM: Unable to complete at this time, RD working remotely.   Diet Order:   Diet Order            DIET - DYS 1 Room service appropriate? Yes; Fluid consistency: Nectar Thick  Diet effective now                 EDUCATION NEEDS:   Not appropriate for education at this time  Skin:  Skin Assessment: Skin Integrity Issues: Skin Integrity Issues:: Diabetic Ulcer Diabetic Ulcer: Left;Right anterior toe  Last BM:  8/13-type 5  Height:   Ht Readings from Last 1 Encounters:  07/28/20 5\' 7"  (1.702 m)    Weight:   Wt Readings from Last 1 Encounters:  07/28/20 43.1 kg    Ideal Body Weight:  67.3 kg  BMI:  Body mass index is 14.88 kg/m.  Estimated Nutritional Needs:   Kcal:  1510-1750  Protein:  75-88  Fluid:  > 1.5 L   09/27/20, RD, LDN Clinical Nutrition After Hours/Weekend Pager # in Amion

## 2020-07-30 NOTE — Progress Notes (Signed)
QT interval is prolonged. Chemistry panel pending this am. Will d/c Zofran for now, change azithromycin to doxy, and continue cardiac monitoring.

## 2020-07-30 NOTE — Evaluation (Signed)
Clinical/Bedside Swallow Evaluation Patient Details  Name: Todd Farrell MRN: [id number] Date of Birth: 12-26-1955  Today's Date: 07/30/2020 Time: SLP Start Time (ACUTE ONLY): 1450 SLP Stop Time (ACUTE ONLY): 1525 SLP Time Calculation (min) (ACUTE ONLY): 35 min  Past Medical History:  Past Medical History:  Diagnosis Date  . BPH (benign prostatic hyperplasia) 07/28/2020  . Mixed diabetic hyperlipidemia associated with type 2 diabetes mellitus (HCC) 07/28/2020  . Vitamin D deficiency 07/28/2020   Past Surgical History: History reviewed. No pertinent surgical history. HPI:  Patient is a 64 y.o. male with PMH: DM-2, HLD, vitamin D deficiency, benign prostate hyperplasia, chronic kidney disease stage IIIb who prestented to hospital via EMS secondary to family concerned he was becoming confused. He was hypothermic and hypoxic upon ED admission, with multiple SIRS criteria and was found to be Covid positive with left lower lobe infiltrate.   Assessment / Plan / Recommendation Clinical Impression  Patient presents with a mild oropharyngeal dysphagia characterized by changes in SpO2 % when eating regular solids, but without overt s/s of aspiration or penetration with thin liquids, puree solids or regular solids.Patient has a weak cough, hoarse and weak voice and non-productive cough. SLP Visit Diagnosis: Dysphagia, unspecified (R13.10)    Aspiration Risk  Mild aspiration risk    Diet Recommendation Dysphagia 2 (Fine chop);Thin liquid   Liquid Administration via: Straw;Cup Medication Administration: Whole meds with puree Supervision: Patient able to self feed;Intermittent supervision to cue for compensatory strategies Compensations: Minimize environmental distractions;Slow rate;Small sips/bites Postural Changes: Seated upright at 90 degrees    Other  Recommendations Oral Care Recommendations: Oral care BID   Follow up Recommendations None      Frequency and Duration min 2x/week  1 week        Prognosis Prognosis for Safe Diet Advancement: Good      Swallow Study   General Date of Onset: 07/28/20 HPI: Patient is a 64 y.o. male with PMH: DM-2, HLD, vitamin D deficiency, benign prostate hyperplasia, chronic kidney disease stage IIIb who prestented to hospital via EMS secondary to family concerned he was becoming confused. He was hypothermic and hypoxic upon ED admission, with multiple SIRS criteria and was found to be Covid positive with left lower lobe infiltrate. Type of Study: Bedside Swallow Evaluation Previous Swallow Assessment: N/A Diet Prior to this Study: NPO Temperature Spikes Noted: No Respiratory Status: Nasal cannula History of Recent Intubation: No Behavior/Cognition: Alert;Cooperative;Pleasant mood Oral Cavity Assessment: Within Functional Limits Oral Care Completed by SLP: Yes Oral Cavity - Dentition: Missing dentition;Other (Comment) (has only one lower tooth) Vision: Functional for self-feeding Self-Feeding Abilities: Able to feed self Patient Positioning: Upright in bed Baseline Vocal Quality: Hoarse;Low vocal intensity;Other (comment) (weak) Volitional Cough: Weak Volitional Swallow: Able to elicit    Oral/Motor/Sensory Function Overall Oral Motor/Sensory Function: Within functional limits   Ice Chips Ice chips: Within functional limits   Thin Liquid Thin Liquid: Within functional limits Presentation: Straw;Self Fed Other Comments: No overt s/s aspiration or penetration with successive straw sips of thin liquids. Pharyngeal contraction and laryngeal elevation WFL    Nectar Thick     Honey Thick     Puree Puree: Within functional limits   Solid     Solid: Impaired Presentation: Self Fed Pharyngeal Phase Impairments: Change in Vital Signs Other Comments: SpO2 dropped to low 80's during intake of cracker.      Angela Nevin, MA, CCC-SLP Speech Therapy [facility]

## 2020-07-30 NOTE — Progress Notes (Signed)
[facility] Progress Note  Patient: Todd Farrell    GGY:[id number]  DOA: 08/06/2020     Date of Service: the patient was seen and examined on 07/30/2020  Brief hospital course: diabetes mellitus type 2, hyperlipidemia, vitamin D deficiency, benign prostatic hyperplasia, chronic kidney disease stage IIIb  Presents with DKA, sepsis secondary to UTI and pneumonia as well as COVID-19. Currently plan is continue antibiotics.  Assessment and Plan: 1.  Sepsis due to combined community-acquired pneumonia as well as UTI and bacteremia with E. coli. Also secondary to COVID-19. POA. Acute sinusitis Met SIRS criteria with lactic acidosis, encephalopathy, tachycardia, hypothermia as well as leukocytosis. Treated aggressively with IV fluids. Currently sepsis physiology has resolved. Lactic acidosis has resolved as well. Blood cultures positive for E. coli. Continuing IV ceftriaxone. CT shows evidence of sinusitis as well as patient has pneumonia on the chest x-ray therefore we will continue azithromycin. Monitor response to treatment.  2.  Acute metabolic encephalopathy In the setting of sepsis and DKA. Currently getting better. Initially was NPO. Currently will initiate dysphagia 1 diet. Speech therapy consulted. CT scan negative for any acute stroke. Metabolic work-up unremarkable  3.  DKA Uncontrolled type 2 diabetes mellitus with hyperglycemia with renal complication Presented with hyperglycemia, anion gap acidosis as well as elevated beta hydroxybutyric acid. Treated with IV insulin. Currently on basal bolus regimen. Monitor.  4.  Acute hypoxic respiratory failure. Acute COVID-19 Viral Pneumonia Lab Results  Component Value Date   SARSCOV2NAA POSITIVE (A) 07/20/2020   CXR: hazy bilateral peripheral opacities CT chest: GGO, consolidation Recent Labs    07/28/20 1701 07/29/20 0258  DDIMER  --  0.99*  CRP 25.8*  --    Tmax last 24 hours:  Temp (24hrs), Avg:97.7 F (36.5  C), Min:97.5 F (36.4 C), Max:98 F (36.7 C)  Oxygen requirements: 3 LPM.  Antibiotics: Ceftriaxone and azithromycin for UTI and pneumonia Vitamin C and Zinc: Currently n.p.o. DVT Prophylaxis: heparin injection 5,000 Units Start: 07/28/20 1400 Remdesivir: Started on 07/28/2020 Steroids: Decadron started on 07/20/2020 Baricitinib/Actemra(off-label use): Not a candidate due to sepsis and bacteremia Prone positioning: Patient encouraged to stay in prone position as much as possible.  The treatment plan and use of medications and known side effects were discussed with patient/family. It was clearly explained that there is no proven definitive treatment for COVID-19 infection yet. Complete risks and long-term side effects are unknown, however in the best clinical judgment they seem to be of some clinical benefit rather than medical risks. Patient/family agree with the treatment plan and want to receive these treatments as indicated.   5.  Hyperkalemia Acute kidney injury on chronic kidney disease stage IIIb Renal function actually improving. Continue with IV hydration.  Monitor.  Potassium also improving.  Monitor.  6.  Severe protein calorie malnutrition Failure to thrive Body mass index is 14.88 kg/m.  Dietary consulted. Monitor. At risk for poor outcome.  7.  Bilateral great toe ulceration Suspect in the setting of PVD. Check ABI. Currently on antibiotic. Presents with an acutely infected.  8.  Poor IV access. Difficult to obtain labs.  9. Prolonged QTC Hypokalemia Potassium corrected. Unable to obtain labs to verify. Continue to monitor.  10 RPR reactive HIV negative Done as a part of encephalopathy work-up. Currently T. pallidum test pending.  Monitor.  Diet: Dysphagia 1 diet DVT Prophylaxis:   heparin injection 5,000 Units Start: 07/28/20 1400    Advance goals of care discussion: Full code  Family Communication: no family was present  at bedside, at the time of  interview.  The pt provided permission to discuss medical plan with the family.  Discussed with patient's daughter per patient's request. opportunity was given to ask question and all questions were answered satisfactorily.   Disposition:  Status is: Inpatient  Remains inpatient appropriate because:IV treatments appropriate due to intensity of illness or inability to take PO   Dispo: The patient is from: Home              Anticipated d/c is to: SNF              Anticipated d/c date is: 3 days              Patient currently is not medically stable to d/c.  Subjective: No nausea no vomiting.  No new lesions.  No acute complaint.  Physical Exam:  General: Appear in mild distress, no Rash; Oral Mucosa Clear, moist. no Abnormal Neck Mass Or lumps, Conjunctiva normal  Cardiovascular: S1 and S2 Present, no Murmur, Respiratory: good respiratory effort, Bilateral Air entry present and bilateral  Crackles, no wheezes Abdomen: Bowel Sound present, Soft and no tenderness Extremities: no Pedal edema, no calf tenderness Neurology: alert and oriented to time, place, and person affect appropriate. no new focal deficit Gait not checked due to patient safety concerns  Vitals:   07/30/20 0752 07/30/20 1119 07/30/20 1606 07/30/20 1644  BP: (!) 141/74 (!) 136/92 (!) 175/128 (!) 154/91  Pulse: 65 88 98 98  Resp:    20  Temp: 97.7 F (36.5 C) (!) 97.5 F (36.4 C) 97.8 F (36.6 C)   TempSrc: Oral Oral Oral   SpO2: (!) 89% 91%  95%  Weight:      Height:        Intake/Output Summary (Last 24 hours) at 07/30/2020 1927 Last data filed at 07/30/2020 1506 Gross per 24 hour  Intake 2688.12 ml  Output --  Net 2688.12 ml   Filed Weights   07/28/20 0012  Weight: 43.1 kg    Data Reviewed: I have personally reviewed and interpreted daily labs, tele strips, imagings as discussed above. I reviewed all nursing notes, pharmacy notes, vitals, pertinent old records I have discussed plan of care as  described above with RN and patient/family.  CBC: Recent Labs  Lab 08/12/2020 2329 07/28/20 0205 07/28/20 1701  WBC 15.9*  --  17.8*  NEUTROABS 14.6*  --  16.8*  HGB 18.2* 16.3 14.8  HCT 59.2* 48.0 45.8  MCV 91.4  --  86.1  PLT 360  --  989   Basic Metabolic Panel: Recent Labs  Lab 07/19/2020 2329 07/26/2020 2329 07/28/20 0205 07/28/20 0536 07/28/20 1701 07/28/20 2212 07/29/20 0226  NA 135   < > 136 142 145 145 145  K 6.2*   < > 4.1 3.1* 4.2 3.8 4.1  CL 85*  --   --  101 105 116* 105  CO2 11*  --   --  24 28 20* 27  GLUCOSE 788*  --   --  303* 109* 300* 296*  BUN 131*  --   --  108* 95* 71* 90*  CREATININE 4.38*  --   --  3.18* 2.75* 1.93* 2.56*  CALCIUM 8.7*  --   --  6.9* 8.0* 5.5* 8.0*   < > = values in this interval not displayed.    Studies: No results found.  Scheduled Meds: . albuterol  2 puff Inhalation Q6H  . dexamethasone (DECADRON) injection  6  mg Intravenous Q24H  . feeding supplement (NEPRO CARB STEADY)  237 mL Oral BID BM  . heparin  5,000 Units Subcutaneous Q8H  . insulin aspart  0-15 Units Subcutaneous Q4H  . insulin detemir  0.1 Units/kg Subcutaneous BID  . linagliptin  5 mg Oral Daily  . multivitamin with minerals  1 tablet Oral Daily   Continuous Infusions: . cefTRIAXone (ROCEPHIN)  IV 2 g (07/30/20 0910)  . doxycycline (VIBRAMYCIN) IV 100 mg (07/30/20 1008)  . lactated ringers 125 mL/hr at 07/30/20 1340  . remdesivir 100 mg in NS 100 mL 100 mg (07/30/20 0907)   PRN Meds: acetaminophen, polyethylene glycol  Time spent: 35 minutes  Author: Berle Mull, MD [facility] 07/30/2020 7:27 PM  To reach On-call, see care teams to locate the attending and reach out via [email or web address]. Between 7PM-7AM, please contact night-coverage If you still have difficulty reaching the attending provider, please page the [facility] (Director on Call) for [facility] on amion for assistance.

## 2020-07-31 ENCOUNTER — Inpatient Hospital Stay (HOSPITAL_COMMUNITY): Payer: HRSA Program

## 2020-07-31 ENCOUNTER — Encounter (HOSPITAL_COMMUNITY): Payer: Medicaid Other

## 2020-07-31 DIAGNOSIS — U071 COVID-19: Secondary | ICD-10-CM

## 2020-07-31 DIAGNOSIS — R609 Edema, unspecified: Secondary | ICD-10-CM | POA: Diagnosis not present

## 2020-07-31 DIAGNOSIS — A4189 Other specified sepsis: Secondary | ICD-10-CM | POA: Diagnosis not present

## 2020-07-31 DIAGNOSIS — I96 Gangrene, not elsewhere classified: Secondary | ICD-10-CM

## 2020-07-31 LAB — CBC WITH DIFFERENTIAL/PLATELET
Abs Immature Granulocytes: 0.22 10*3/uL — ABNORMAL HIGH (ref 0.00–0.07)
Basophils Absolute: 0.1 10*3/uL (ref 0.0–0.1)
Basophils Relative: 0 %
Eosinophils Absolute: 0 10*3/uL (ref 0.0–0.5)
Eosinophils Relative: 0 %
HCT: 49.7 % (ref 39.0–52.0)
Hemoglobin: 16.3 g/dL (ref 13.0–17.0)
Immature Granulocytes: 1 %
Lymphocytes Relative: 4 %
Lymphs Abs: 0.7 10*3/uL (ref 0.7–4.0)
MCH: 28.8 pg (ref 26.0–34.0)
MCHC: 32.8 g/dL (ref 30.0–36.0)
MCV: 87.8 fL (ref 80.0–100.0)
Monocytes Absolute: 0.8 10*3/uL (ref 0.1–1.0)
Monocytes Relative: 5 %
Neutro Abs: 13.9 10*3/uL — ABNORMAL HIGH (ref 1.7–7.7)
Neutrophils Relative %: 90 %
Platelets: 271 10*3/uL (ref 150–400)
RBC: 5.66 MIL/uL (ref 4.22–5.81)
RDW: 13.1 % (ref 11.5–15.5)
WBC: 15.6 10*3/uL — ABNORMAL HIGH (ref 4.0–10.5)
nRBC: 0 % (ref 0.0–0.2)

## 2020-07-31 LAB — COMPREHENSIVE METABOLIC PANEL
ALT: 13 U/L (ref 0–44)
AST: 35 U/L (ref 15–41)
Albumin: 1.8 g/dL — ABNORMAL LOW (ref 3.5–5.0)
Alkaline Phosphatase: 73 U/L (ref 38–126)
Anion gap: 15 (ref 5–15)
BUN: 56 mg/dL — ABNORMAL HIGH (ref 8–23)
CO2: 24 mmol/L (ref 22–32)
Calcium: 8 mg/dL — ABNORMAL LOW (ref 8.9–10.3)
Chloride: 109 mmol/L (ref 98–111)
Creatinine, Ser: 1.76 mg/dL — ABNORMAL HIGH (ref 0.61–1.24)
GFR calc Af Amer: 47 mL/min — ABNORMAL LOW (ref >60)
GFR calc non Af Amer: 40 mL/min — ABNORMAL LOW (ref >60)
Glucose, Bld: 96 mg/dL (ref 70–99)
Potassium: 3.4 mmol/L — ABNORMAL LOW (ref 3.5–5.1)
Sodium: 148 mmol/L — ABNORMAL HIGH (ref 135–145)
Total Bilirubin: 0.7 mg/dL (ref 0.3–1.2)
Total Protein: 6 g/dL — ABNORMAL LOW (ref 6.5–8.1)

## 2020-07-31 LAB — MAGNESIUM: Magnesium: 1.5 mg/dL — ABNORMAL LOW (ref 1.7–2.4)

## 2020-07-31 LAB — GLUCOSE, CAPILLARY
Glucose-Capillary: 108 mg/dL — ABNORMAL HIGH (ref 70–99)
Glucose-Capillary: 117 mg/dL — ABNORMAL HIGH (ref 70–99)
Glucose-Capillary: 150 mg/dL — ABNORMAL HIGH (ref 70–99)
Glucose-Capillary: 155 mg/dL — ABNORMAL HIGH (ref 70–99)
Glucose-Capillary: 196 mg/dL — ABNORMAL HIGH (ref 70–99)
Glucose-Capillary: 226 mg/dL — ABNORMAL HIGH (ref 70–99)
Glucose-Capillary: 54 mg/dL — ABNORMAL LOW (ref 70–99)
Glucose-Capillary: 55 mg/dL — ABNORMAL LOW (ref 70–99)
Glucose-Capillary: 97 mg/dL (ref 70–99)

## 2020-07-31 LAB — FERRITIN: Ferritin: 1229 ng/mL — ABNORMAL HIGH (ref 24–336)

## 2020-07-31 LAB — C-REACTIVE PROTEIN: CRP: 8.6 mg/dL — ABNORMAL HIGH (ref <1.0)

## 2020-07-31 LAB — D-DIMER, QUANTITATIVE: D-Dimer, Quant: 8.24 ug/mL-FEU — ABNORMAL HIGH (ref 0.00–0.50)

## 2020-07-31 MED ORDER — ALUM & MAG HYDROXIDE-SIMETH 200-200-20 MG/5ML PO SUSP
30.0000 mL | Freq: Once | ORAL | Status: AC
  Administered 2020-07-31: 30 mL via ORAL
  Filled 2020-07-31: qty 30

## 2020-07-31 MED ORDER — HEPARIN (PORCINE) 25000 UT/250ML-% IV SOLN
750.0000 [IU]/h | INTRAVENOUS | Status: DC
  Administered 2020-07-31: 700 [IU]/h via INTRAVENOUS
  Administered 2020-08-02: 750 [IU]/h via INTRAVENOUS
  Filled 2020-07-31 (×2): qty 250

## 2020-07-31 MED ORDER — DEXTROSE 50 % IV SOLN
INTRAVENOUS | Status: AC
  Administered 2020-07-31: 50 mL via INTRAVENOUS
  Filled 2020-07-31: qty 50

## 2020-07-31 MED ORDER — CEFAZOLIN SODIUM-DEXTROSE 2-4 GM/100ML-% IV SOLN
2.0000 g | Freq: Two times a day (BID) | INTRAVENOUS | Status: DC
  Filled 2020-07-31: qty 100

## 2020-07-31 MED ORDER — MAGNESIUM SULFATE 2 GM/50ML IV SOLN
2.0000 g | Freq: Once | INTRAVENOUS | Status: AC
  Administered 2020-07-31: 2 g via INTRAVENOUS
  Filled 2020-07-31: qty 50

## 2020-07-31 MED ORDER — INSULIN ASPART 100 UNIT/ML ~~LOC~~ SOLN
0.0000 [IU] | Freq: Three times a day (TID) | SUBCUTANEOUS | Status: DC
  Administered 2020-07-31: 5 [IU] via SUBCUTANEOUS
  Administered 2020-08-01: 8 [IU] via SUBCUTANEOUS
  Administered 2020-08-01: 5 [IU] via SUBCUTANEOUS
  Administered 2020-08-01 – 2020-08-02 (×2): 2 [IU] via SUBCUTANEOUS
  Administered 2020-08-02: 5 [IU] via SUBCUTANEOUS
  Administered 2020-08-02: 3 [IU] via SUBCUTANEOUS
  Administered 2020-08-03: 5 [IU] via SUBCUTANEOUS
  Administered 2020-08-03 – 2020-08-04 (×2): 3 [IU] via SUBCUTANEOUS
  Administered 2020-08-05: 8 [IU] via SUBCUTANEOUS
  Administered 2020-08-06 – 2020-08-07 (×2): 2 [IU] via SUBCUTANEOUS
  Administered 2020-08-07: 3 [IU] via SUBCUTANEOUS
  Administered 2020-08-07: 2 [IU] via SUBCUTANEOUS
  Administered 2020-08-08: 3 [IU] via SUBCUTANEOUS
  Administered 2020-08-08: 2 [IU] via SUBCUTANEOUS
  Administered 2020-08-08: 3 [IU] via SUBCUTANEOUS
  Administered 2020-08-09: 2 [IU] via SUBCUTANEOUS
  Administered 2020-08-09: 8 [IU] via SUBCUTANEOUS
  Administered 2020-08-10 (×2): 3 [IU] via SUBCUTANEOUS
  Administered 2020-08-11: 11 [IU] via SUBCUTANEOUS

## 2020-07-31 MED ORDER — DEXTROSE 50 % IV SOLN: 1.0000 | Freq: Once | INTRAVENOUS | Status: AC | PRN

## 2020-07-31 MED ORDER — LIDOCAINE VISCOUS HCL 2 % MT SOLN
15.0000 mL | Freq: Once | OROMUCOSAL | Status: AC
  Administered 2020-07-31: 15 mL via ORAL
  Filled 2020-07-31: qty 15

## 2020-07-31 MED ORDER — HEPARIN BOLUS VIA INFUSION
2000.0000 [IU] | Freq: Once | INTRAVENOUS | Status: AC
  Administered 2020-07-31: 2000 [IU] via INTRAVENOUS
  Filled 2020-07-31: qty 2000

## 2020-07-31 NOTE — Progress Notes (Signed)
Inpatient Diabetes Program Recommendations  AACE/ADA: New Consensus Statement on Inpatient Glycemic Control (2015)  Target Ranges:  Prepandial:   less than 140 mg/dL      Peak postprandial:   less than 180 mg/dL (1-2 hours)      Critically ill patients:  140 - 180 mg/dL   Lab Results  Component Value Date   GLUCAP 97 07/31/2020   HGBA1C 14.0 (H) 07/28/2020    Review of Glycemic Control Results for DEMARRIO, MENGES (MRN [id number]) as of 07/31/2020 09:34  Ref. Range 07/31/2020 04:29 07/31/2020 04:47 07/31/2020 05:07 07/31/2020 07:33  Glucose-Capillary Latest Ref Range: 70 - 99 mg/dL 55 (L) 54 (L) 329 (H) 97    Inpatient Diabetes Program Recommendations:    Noted, levemir was discontinued given hypoglycemia.    If patient is eating, please consider,  Novolog 0-15 TID with meals to avoid Q4H insulin stacking.    Will continue to follow while inpatient.  Thank you, Dulce Sellar, RN, BSN Diabetes Coordinator Inpatient Diabetes Program [phone] (team pager from 8a-5p)

## 2020-07-31 NOTE — Progress Notes (Signed)
Attempted lower extremity venous and arterial duplexes, however patient vomiting and needs to be cleaned. Will try again later as patient feels better and schedule permits.  07/31/2020 8:47 AM Eula Fried., MHA, RVT, RDCS, RDMS

## 2020-07-31 NOTE — Progress Notes (Signed)
[facility] Progress Note  Patient: Todd Farrell    GLO:[id number]  DOA: 08/07/2020     Date of Service: the patient was seen and examined on 07/31/2020  Brief hospital course: diabetes mellitus type 2, hyperlipidemia, vitamin D deficiency, benign prostatic hyperplasia, chronic kidney disease stage IIIb. Presents with DKA, sepsis secondary to UTI and pneumonia as well as COVID-19. Currently plan is continue antibiotics.  Assessment and Plan: 1.  Sepsis due to combined community-acquired pneumonia as well as UTI and bacteremia with E. coli. Also secondary to COVID-19. POA. Acute sinusitis Met SIRS criteria with lactic acidosis, encephalopathy, tachycardia, hypothermia as well as leukocytosis. Treated aggressively with IV fluids. Currently sepsis physiology has resolved. Lactic acidosis has resolved as well. Blood cultures positive for E. coli. Continuing IV ceftriaxone. CT shows evidence of sinusitis as well as patient has pneumonia on the chest x-ray therefore we will continue azithromycin. Monitor response to treatment.  2.  Acute metabolic encephalopathy In the setting of sepsis and DKA. Currently getting better. Initially was NPO. Currently will initiate dysphagia 1 diet. Speech therapy consulted. CT scan negative for any acute stroke. Metabolic work-up unremarkable.  3.  DKA Uncontrolled type 2 diabetes mellitus with hyperglycemia with renal complication Presented with hyperglycemia, anion gap acidosis as well as elevated beta hydroxybutyric acid. Treated with IV insulin. Currently on basal bolus regimen. Monitor.  4.  Acute hypoxic respiratory failure. Acute COVID-19 Viral Pneumonia Lab Results  Component Value Date   SARSCOV2NAA POSITIVE (A) 07/18/2020   CXR: hazy bilateral peripheral opacities CT chest: GGO, consolidation Recent Labs    07/29/20 0258 07/31/20 0635  DDIMER 0.99* 8.24*  FERRITIN  --  1,229*  CRP  --  8.6*   Tmax last 24 hours:  Temp  (24hrs), Avg:97.9 F (36.6 C), Min:97.8 F (36.6 C), Max:98 F (36.7 C)  Oxygen requirements: 3 LPM.  Antibiotics: Ceftriaxone and azithromycin for UTI and pneumonia Vitamin C and Zinc: Currently n.p.o. DVT Prophylaxis:  Remdesivir: Started on 07/28/2020 Steroids: Decadron started on 07/20/2020 Baricitinib/Actemra(off-label use): Not a candidate due to sepsis and bacteremia Prone positioning: Patient encouraged to stay in prone position as much as possible.  The treatment plan and use of medications and known side effects were discussed with patient/family. It was clearly explained that there is no proven definitive treatment for COVID-19 infection yet. Complete risks and long-term side effects are unknown, however in the best clinical judgment they seem to be of some clinical benefit rather than medical risks. Patient/family agree with the treatment plan and want to receive these treatments as indicated.   5.  Hyperkalemia Acute kidney injury on chronic kidney disease stage IIIb Renal function actually improving. Continue with IV hydration.  Monitor.  Potassium also improving.  Monitor.  6.  Severe protein calorie malnutrition Failure to thrive Body mass index is 14.88 kg/m.  Dietary consulted. Monitor. At risk for poor outcome.  7.  Bilateral great toe ulceration Suspect in the setting of PVD. Check ABI. Currently on antibiotic. Presents with an acutely infected.  8.  Poor IV access. Difficult to obtain labs.  9. Prolonged QTC Hypokalemia Potassium corrected. Unable to obtain labs to verify. Continue to monitor.  10 RPR reactive HIV negative Done as a part of encephalopathy work-up. Currently T. pallidum test pending.  Monitor.  11.  Vomiting. X-ray abdomen shows no acute abnormality.  Needs no further work-up.  12.  DVT Right posterior tibial acute DVT. This appears to be distal DVT at present given that the patient  has limited mobility currently think the  patient will remain at high risk for progression of this DVT and therefore strategy of monitoring progression with serial Dopplers will not be successful. We will recommend heparin for DVT treatment for now.  13.  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 Patient denies any acute pain in his legs right now. Suspect this is chronic in nature. Vascular surgery will be consulted. Currently on heparin.  Diet: Dysphagia 1 diet DVT Prophylaxis:       Advance goals of care discussion: Full code  Family Communication: no family was present at bedside, at the time of interview.  The pt provided permission to discuss medical plan with the family.  Discussed with patient's daughter per patient's request. opportunity was given to ask question and all questions were answered satisfactorily.   Disposition:  Status is: Inpatient  Remains inpatient appropriate because:IV treatments appropriate due to intensity of illness or inability to take PO   Dispo: The patient is from: Home              Anticipated d/c is to: SNF              Anticipated d/c date is: 3 days              Patient currently is not medically stable to d/c.  Subjective: One episode of vomiting.  No fever no chills.  No complaint.  No diarrhea constipation.  Nausea resolved.  Tolerating oral diet.  Physical Exam:  General: Appear in mild distress, no Rash; Oral Mucosa Clear, moist. no Abnormal Neck Mass Or lumps, Conjunctiva normal  Cardiovascular: S1 and S2 Present, no Murmur, Respiratory: good respiratory effort, Bilateral Air entry present and bilateral  Crackles, no wheezes Abdomen: Bowel Sound present, Soft and no tenderness Extremities: no Pedal edema, no calf tenderness Neurology: alert and oriented to time, place, and person affect appropriate. no new focal deficit Gait not checked due to patient safety concerns  Vitals:    07/31/20 0753 07/31/20 1122 07/31/20 1208 07/31/20 1552  BP:  (!) 123/93    Pulse: 89 79 74   Resp:      Temp:  97.8 F (36.6 C)  98 F (36.7 C)  TempSrc:  Oral  Axillary  SpO2: 94% 99% 98%   Weight:      Height:        Intake/Output Summary (Last 24 hours) at 07/31/2020 1808 Last data filed at 07/31/2020 1707 Gross per 24 hour  Intake 1215.95 ml  Output 1150 ml  Net 65.95 ml   Filed Weights   07/28/20 0012  Weight: 43.1 kg    Data Reviewed: I have personally reviewed and interpreted daily labs, tele strips, imagings as discussed above. I reviewed all nursing notes, pharmacy notes, vitals, pertinent old records I have discussed plan of care as described above with RN and patient/family.  CBC: Recent Labs  Lab 07/18/2020 2329 07/28/20 0205 07/28/20 1701 07/31/20 0635  WBC 15.9*  --  17.8* 15.6*  NEUTROABS 14.6*  --  16.8* 13.9*  HGB 18.2* 16.3 14.8 16.3  HCT 59.2* 48.0 45.8 49.7  MCV 91.4  --  86.1 87.8  PLT 360  --  269 785   Basic Metabolic Panel: Recent Labs  Lab 07/28/20 0536 07/28/20 1701 07/28/20 2212 07/29/20 0226 07/31/20 0635  NA 142 145 145 145 148*  K 3.1* 4.2 3.8 4.1 3.4*  CL 101 105 116* 105 109  CO2 24 28 20* 27 24  GLUCOSE 303* 109* 300* 296* 96  BUN 108* 95* 71* 90* 56*  CREATININE 3.18* 2.75* 1.93* 2.56* 1.76*  CALCIUM 6.9* 8.0* 5.5* 8.0* 8.0*  MG  --   --   --   --  1.5*    Studies: DG Abd Portable 1V  Result Date: 07/31/2020 CLINICAL DATA:  Nausea and vomiting.  History of sepsis. EXAM: PORTABLE ABDOMEN - 1 VIEW COMPARISON:  None. FINDINGS: Gas is demonstrated within nondilated loops of large and small bowel. Supine evaluation limited for the detection of free intraperitoneal air. Lumbar spine degenerative changes. IMPRESSION: Nonobstructed bowel gas pattern. Electronically Signed   By: Lovey Newcomer M.D.   On: 07/31/2020 10:01   VAS [place] LOWER EXTREMITY ARTERIAL DUPLEX  Result Date: 07/31/2020 LOWER EXTREMITY ARTERIAL DUPLEX STUDY  Indications: Gangrene.  Current ABI: Not obtained Limitations: COVID-19 positive. Comparison Study: No prior study Performing Technologist: Maudry Mayhew MHA, RDMS, RVT, RDCS  Examination Guidelines: A complete evaluation includes B-mode imaging, spectral Doppler, color Doppler, and power Doppler as needed of all accessible portions of each vessel. Bilateral testing is considered an integral part of a complete examination. Limited examinations for reoccurring indications may be performed as noted.  +----------+--------+-----+--------+----------+--------+ RIGHT     PSV cm/sRatioStenosisWaveform  Comments +----------+--------+-----+--------+----------+--------+ CFA Distal30                   biphasic           +----------+--------+-----+--------+----------+--------+ DFA                    occluded                   +----------+--------+-----+--------+----------+--------+ SFA Prox  38                   biphasic           +----------+--------+-----+--------+----------+--------+ SFA Mid   30                   biphasic           +----------+--------+-----+--------+----------+--------+ SFA Distal31                   biphasic           +----------+--------+-----+--------+----------+--------+ POP Prox  31                   biphasic           +----------+--------+-----+--------+----------+--------+ TP Trunk  46                   biphasic           +----------+--------+-----+--------+----------+--------+ ATA Distal28                   biphasic           +----------+--------+-----+--------+----------+--------+ PTA Distal12                   monophasic         +----------+--------+-----+--------+----------+--------+  +-----------+--------+-----+--------+----------+--------+ LEFT       PSV cm/sRatioStenosisWaveform  Comments +-----------+--------+-----+--------+----------+--------+ CFA Distal 36                   biphasic            +-----------+--------+-----+--------+----------+--------+ DFA        36  biphasic           +-----------+--------+-----+--------+----------+--------+ SFA Prox   29                   biphasic           +-----------+--------+-----+--------+----------+--------+ SFA Mid    35                   biphasic           +-----------+--------+-----+--------+----------+--------+ SFA Distal 24                   biphasic           +-----------+--------+-----+--------+----------+--------+ POP Distal 22                   biphasic           +-----------+--------+-----+--------+----------+--------+ TP Trunk   30                   biphasic           +-----------+--------+-----+--------+----------+--------+ ATA Distal 29                   monophasic         +-----------+--------+-----+--------+----------+--------+ PTA Prox                occluded                   +-----------+--------+-----+--------+----------+--------+ PTA Distal              occluded                   +-----------+--------+-----+--------+----------+--------+ PERO Distal             occluded                   +-----------+--------+-----+--------+----------+--------+  Summary: Right: Total occlusion noted in the deep femoral artery. Left: Total occlusion noted in the posterior tibial artery. Total occlusion noted in the peroneal artery.  See table(s) above for measurements and observations.    Preliminary    VAS [place] LOWER EXTREMITY VENOUS (DVT)  Result Date: 07/31/2020  Lower Venous DVTStudy Indications: COVID-19 positive.  Comparison Study: No prior study Performing Technologist: Maudry Mayhew MHA, RDMS, RVT, RDCS  Examination Guidelines: A complete evaluation includes B-mode imaging, spectral Doppler, color Doppler, and power Doppler as needed of all accessible portions of each vessel. Bilateral testing is considered an integral part of a complete examination. Limited  examinations for reoccurring indications may be performed as noted. The reflux portion of the exam is performed with the patient in reverse Trendelenburg.  +---------+---------------+---------+-----------+----------+--------------+ RIGHT    CompressibilityPhasicitySpontaneityPropertiesThrombus Aging +---------+---------------+---------+-----------+----------+--------------+ CFV      Full           Yes      Yes                                 +---------+---------------+---------+-----------+----------+--------------+ SFJ      Full                                                        +---------+---------------+---------+-----------+----------+--------------+ FV Prox  Full                                                        +---------+---------------+---------+-----------+----------+--------------+  FV Mid   Full                                                        +---------+---------------+---------+-----------+----------+--------------+ FV DistalFull                                                        +---------+---------------+---------+-----------+----------+--------------+ PFV      Full                                                        +---------+---------------+---------+-----------+----------+--------------+ POP      Full           Yes      Yes                                 +---------+---------------+---------+-----------+----------+--------------+ PTV      None                    No                   Acute          +---------+---------------+---------+-----------+----------+--------------+ PERO                                                  Not visualized +---------+---------------+---------+-----------+----------+--------------+   +---------+---------------+---------+-----------+----------+--------------+ LEFT     CompressibilityPhasicitySpontaneityPropertiesThrombus Aging  +---------+---------------+---------+-----------+----------+--------------+ CFV      Full           Yes      Yes                                 +---------+---------------+---------+-----------+----------+--------------+ SFJ      Full                                                        +---------+---------------+---------+-----------+----------+--------------+ FV Prox  Full                                                        +---------+---------------+---------+-----------+----------+--------------+ FV Mid   Full                                                        +---------+---------------+---------+-----------+----------+--------------+  FV DistalFull                                                        +---------+---------------+---------+-----------+----------+--------------+ PFV      Full                                                        +---------+---------------+---------+-----------+----------+--------------+ POP      Full           Yes      Yes                                 +---------+---------------+---------+-----------+----------+--------------+ PTV                              Yes                                 +---------+---------------+---------+-----------+----------+--------------+ PERO                             Yes                                 +---------+---------------+---------+-----------+----------+--------------+   Left Technical Findings: Not visualized segments include limited evaluation of left PTV and peroneal veins.   Summary: RIGHT: - Findings consistent with acute deep vein thrombosis involving the right posterior tibial veins. - No cystic structure found in the popliteal fossa.  LEFT: - There is no evidence of deep vein thrombosis in the lower extremity. However, portions of this examination were limited- see technologist comments above.  - No cystic structure found in the popliteal fossa.  *See  table(s) above for measurements and observations.    Preliminary     Scheduled Meds: . albuterol  2 puff Inhalation Q6H  . dexamethasone (DECADRON) injection  6 mg Intravenous Q24H  . feeding supplement (NEPRO CARB STEADY)  237 mL Oral BID BM  . insulin aspart  0-15 Units Subcutaneous TID WC  . multivitamin with minerals  1 tablet Oral Daily   Continuous Infusions: . [START ON 08/01/2020]  ceFAZolin (ANCEF) IV    . doxycycline (VIBRAMYCIN) IV Stopped (07/31/20 1259)  . heparin 700 Units/hr (07/31/20 1652)  . lactated ringers 125 mL/hr at 07/31/20 1655  . remdesivir 100 mg in NS 100 mL Stopped (07/31/20 1259)   PRN Meds: acetaminophen, polyethylene glycol  Time spent: 35 minutes  Author: Berle Mull, MD [facility] 07/31/2020 6:08 PM  To reach On-call, see care teams to locate the attending and reach out via [email or web address]. Between 7PM-7AM, please contact night-coverage If you still have difficulty reaching the attending provider, please page the [facility] (Director on Call) for [facility] on amion for assistance.

## 2020-07-31 NOTE — Progress Notes (Signed)
ANTICOAGULATION CONSULT NOTE - Initial Consult  Pharmacy Consult for heparin Indication: DVT  No Known Allergies  Patient Measurements: Height: 5\' 7"  (170.2 cm) Weight: 43.1 kg (95 lb) IBW/kg (Calculated) : 66.1 Heparin Dosing Weight: 43.1 kg   Vital Signs: Temp: 98 F (36.7 C) (08/15 1552) Temp Source: Axillary (08/15 1552) BP: 123/93 (08/15 1122) Pulse Rate: 74 (08/15 1208)  Labs: Recent Labs    07/28/20 1701 07/28/20 1701 07/28/20 2212 07/29/20 0226 07/31/20 0635  HGB 14.8  --   --   --  16.3  HCT 45.8  --   --   --  49.7  PLT 269  --   --   --  271  CREATININE 2.75*   < > 1.93* 2.56* 1.76*   < > = values in this interval not displayed.    Estimated Creatinine Clearance: 26.2 mL/min (A) (by C-G formula based on SCr of 1.76 mg/dL (H)).   Medical History: Past Medical History:  Diagnosis Date  . BPH (benign prostatic hyperplasia) 07/28/2020  . Mixed diabetic hyperlipidemia associated with type 2 diabetes mellitus (HCC) 07/28/2020  . Vitamin D deficiency 07/28/2020    Medications:  Scheduled:  . albuterol  2 puff Inhalation Q6H  . dexamethasone (DECADRON) injection  6 mg Intravenous Q24H  . feeding supplement (NEPRO CARB STEADY)  237 mL Oral BID BM  . heparin  5,000 Units Subcutaneous Q8H  . insulin aspart  0-15 Units Subcutaneous TID WC  . multivitamin with minerals  1 tablet Oral Daily    Assessment: 63 yom presenting with UTI/PNA secondary to COVID19. Duplex completed today finding acute DVT in R posterior tibial veins/artery in addition to total occlusion in deep femoral artery. No AC PTA.   Last dose of heparin subQ was on 8/15@1258 . Hgb 16.3, plt 271. D-dimer increased from 0.99 to 8.24. No s/sx of bleeding.   Goal of Therapy:  Heparin level 0.3-0.7 units/ml Monitor platelets by anticoagulation protocol: Yes   Plan:  Give 2000 units bolus x 1 Start heparin infusion at 700 units/hr Check anti-Xa level in 8 hours and daily while on  heparin Continue to monitor H&H and platelets  9/15, PharmD, BCCCP Clinical Pharmacist  Phone: [phone] 07/31/2020 4:21 PM  Please check AMION for all [facility] Pharmacy phone numbers After 10:00 PM, call Main Pharmacy [phone]

## 2020-07-31 NOTE — Progress Notes (Signed)
Patient had episode of emesis following breakfast. Patient stated he was not hungry but tried to consume grits however they came right back up. Dr. Allena Katz paged to make aware. Will continue to monitor.

## 2020-07-31 NOTE — Progress Notes (Signed)
Hypoglycemic Event  CBG: 55  Treatment: 4oz.Juice  Symptoms: none  Follow-up CBG: Time:04:47 CBG Result:54  Treatment: Dextrose 50% 94mL  Follow up CBG: 155  Possible Reasons for Event: No bedtime snack  Comments/MD notified: [place], MD responded and discontinued long acting insulin order.  Will continue to monitor.    Mana Haberl, Joan Mayans

## 2020-07-31 NOTE — Progress Notes (Signed)
Bilateral lower extremity venous and arterial duplexes completed.  Refer to "CV Proc" under chart review to view preliminary results.  Preliminary results discussed with Dr. Allena Katz.  07/31/2020 4:10 PM Eula Fried., MHA, RVT, RDCS, RDMS

## 2020-07-31 NOTE — Progress Notes (Signed)
Pharmacy Antibiotic Note  Melven Stockard is a 64 y.o. male admitted on 07/24/2020 with E. Coli PCN sensitive bacteremia and COVID19. Pharmacy has been consulted for cefazolin dosing.  Plan: Cefazolin 2G IV Q12H  Monitor for signs and symptoms of allergic reaction  Monitor renal function and clinical progression   Height: 5\' 7"  (170.2 cm) Weight: 43.1 kg (95 lb) IBW/kg (Calculated) : 66.1  Temp (24hrs), Avg:97.8 F (36.6 C), Min:97.8 F (36.6 C), Max:97.9 F (36.6 C)  Recent Labs  Lab 07/28/2020 2329 07/26/2020 2329 07/28/20 0229 07/28/20 0425 07/28/20 0536 07/28/20 1700 07/28/20 1701 07/28/20 2212 07/29/20 0226 07/31/20 0635  WBC 15.9*  --   --   --   --   --  17.8*  --   --  15.6*  CREATININE 4.38*   < >  --   --  3.18*  --  2.75* 1.93* 2.56* 1.76*  LATICACIDVEN 8.6*  --  3.8* 5.2*  --  3.2*  --  2.0*  --   --    < > = values in this interval not displayed.    Estimated Creatinine Clearance: 26.2 mL/min (A) (by C-G formula based on SCr of 1.76 mg/dL (H)).    No Known Allergies  Antimicrobials this admission: Cefepime x 1 8/12 Flagyl x 1 8/12 Vanc x 1 8/12 CTX 8/12>>8/15 Azithro 8/12>> 8/13 (d/ced d/t QTc) Doxy 8/14 >> Remdes 8/12>>  Microbiology results: 8/11 BCx: E. Coli PCN sens 8/11 UCx: E. Coli  8/11 MRSA PCR: neg 8/11 Covid: positive   Thank you for allowing pharmacy to be a part of this patients care.  10/11, PharmD, MBA Pharmacy Resident [phone] 07/31/2020 1:21 PM

## 2020-08-01 DIAGNOSIS — I70262 Atherosclerosis of native arteries of extremities with gangrene, left leg: Secondary | ICD-10-CM

## 2020-08-01 DIAGNOSIS — U071 COVID-19: Secondary | ICD-10-CM | POA: Diagnosis not present

## 2020-08-01 DIAGNOSIS — N183 Chronic kidney disease, stage 3 unspecified: Secondary | ICD-10-CM

## 2020-08-01 DIAGNOSIS — A4189 Other specified sepsis: Secondary | ICD-10-CM | POA: Diagnosis not present

## 2020-08-01 LAB — COMPREHENSIVE METABOLIC PANEL
ALT: 15 U/L (ref 0–44)
AST: 23 U/L (ref 15–41)
Albumin: 1.4 g/dL — ABNORMAL LOW (ref 3.5–5.0)
Alkaline Phosphatase: 68 U/L (ref 38–126)
Anion gap: 12 (ref 5–15)
BUN: 45 mg/dL — ABNORMAL HIGH (ref 8–23)
CO2: 23 mmol/L (ref 22–32)
Calcium: 7 mg/dL — ABNORMAL LOW (ref 8.9–10.3)
Chloride: 108 mmol/L (ref 98–111)
Creatinine, Ser: 1.57 mg/dL — ABNORMAL HIGH (ref 0.61–1.24)
GFR calc Af Amer: 54 mL/min — ABNORMAL LOW (ref >60)
GFR calc non Af Amer: 46 mL/min — ABNORMAL LOW (ref >60)
Glucose, Bld: 261 mg/dL — ABNORMAL HIGH (ref 70–99)
Potassium: 3.4 mmol/L — ABNORMAL LOW (ref 3.5–5.1)
Sodium: 143 mmol/L (ref 135–145)
Total Bilirubin: 0.8 mg/dL (ref 0.3–1.2)
Total Protein: 4.8 g/dL — ABNORMAL LOW (ref 6.5–8.1)

## 2020-08-01 LAB — CBC WITH DIFFERENTIAL/PLATELET
Abs Immature Granulocytes: 0.2 10*3/uL — ABNORMAL HIGH (ref 0.00–0.07)
Basophils Absolute: 0 10*3/uL (ref 0.0–0.1)
Basophils Relative: 0 %
Eosinophils Absolute: 0 10*3/uL (ref 0.0–0.5)
Eosinophils Relative: 0 %
HCT: 40.6 % (ref 39.0–52.0)
Hemoglobin: 13.3 g/dL (ref 13.0–17.0)
Immature Granulocytes: 2 %
Lymphocytes Relative: 6 %
Lymphs Abs: 0.7 10*3/uL (ref 0.7–4.0)
MCH: 28.5 pg (ref 26.0–34.0)
MCHC: 32.8 g/dL (ref 30.0–36.0)
MCV: 86.9 fL (ref 80.0–100.0)
Monocytes Absolute: 0.7 10*3/uL (ref 0.1–1.0)
Monocytes Relative: 7 %
Neutro Abs: 9.3 10*3/uL — ABNORMAL HIGH (ref 1.7–7.7)
Neutrophils Relative %: 85 %
Platelets: 224 10*3/uL (ref 150–400)
RBC: 4.67 MIL/uL (ref 4.22–5.81)
RDW: 13.1 % (ref 11.5–15.5)
WBC: 10.9 10*3/uL — ABNORMAL HIGH (ref 4.0–10.5)
nRBC: 0.2 % (ref 0.0–0.2)

## 2020-08-01 LAB — HEPARIN LEVEL (UNFRACTIONATED)
Heparin Unfractionated: 0.98 IU/mL — ABNORMAL HIGH (ref 0.30–0.70)
Heparin Unfractionated: 0.15 IU/mL — ABNORMAL LOW (ref 0.30–0.70)
Heparin Unfractionated: <0.1 IU/mL — ABNORMAL LOW (ref 0.30–0.70)

## 2020-08-01 LAB — GLUCOSE, CAPILLARY
Glucose-Capillary: 249 mg/dL — ABNORMAL HIGH (ref 70–99)
Glucose-Capillary: 296 mg/dL — ABNORMAL HIGH (ref 70–99)
Glucose-Capillary: 139 mg/dL — ABNORMAL HIGH (ref 70–99)
Glucose-Capillary: 206 mg/dL — ABNORMAL HIGH (ref 70–99)

## 2020-08-01 LAB — VITAMIN B1: Vitamin B1 (Thiamine): 115.6 nmol/L (ref 66.5–200.0)

## 2020-08-01 LAB — FERRITIN: Ferritin: 971 ng/mL — ABNORMAL HIGH (ref 24–336)

## 2020-08-01 LAB — C-REACTIVE PROTEIN: CRP: 4.5 mg/dL — ABNORMAL HIGH (ref <1.0)

## 2020-08-01 LAB — T.PALLIDUM AB, TOTAL: T Pallidum Abs: NONREACTIVE

## 2020-08-01 LAB — MAGNESIUM: Magnesium: 1.7 mg/dL (ref 1.7–2.4)

## 2020-08-01 LAB — D-DIMER, QUANTITATIVE: D-Dimer, Quant: 3.67 ug/mL-FEU — ABNORMAL HIGH (ref 0.00–0.50)

## 2020-08-01 MED ORDER — AMOXICILLIN-POT CLAVULANATE 875-125 MG PO TABS
1.0000 | ORAL_TABLET | Freq: Two times a day (BID) | ORAL | Status: DC
  Administered 2020-08-01 – 2020-08-02 (×3): 1 via ORAL
  Filled 2020-08-01 (×3): qty 1

## 2020-08-01 NOTE — Progress Notes (Signed)
ANTICOAGULATION CONSULT NOTE  Pharmacy Consult for heparin Indication: DVT  No Known Allergies  Patient Measurements: Height: 5\' 7"  (170.2 cm) Weight: 43.1 kg (95 lb) IBW/kg (Calculated) : 66.1 Heparin Dosing Weight: 43.1 kg   Vital Signs: Temp: 97.7 F (36.5 C) (08/15 2000) Temp Source: Oral (08/15 2000) BP: 141/89 (08/15 2000) Pulse Rate: 96 (08/15 2000)  Labs: Recent Labs    07/31/20 0635 08/01/20 0152  HGB 16.3 13.3  HCT 49.7 40.6  PLT 271 224  HEPARINUNFRC  --  0.98*  CREATININE 1.76* 1.57*    Estimated Creatinine Clearance: 29.4 mL/min (A) (by C-G formula based on SCr of 1.57 mg/dL (H)).  Assessment: 64 y.o. male with RLE DVT for heparin  Goal of Therapy:  Heparin level 0.3-0.7 units/ml Monitor platelets by anticoagulation protocol: Yes   Plan:  Decrease Heparin 600 units/hr Check heparin level in 8 hours.  64, PharmD, BCPS

## 2020-08-01 NOTE — Progress Notes (Signed)
ANTICOAGULATION CONSULT NOTE - Follow Up Consult  Pharmacy Consult for Heparin Indication: DVT  No Known Allergies  Patient Measurements: Height: 5\' 7"  (170.2 cm) Weight: 43.1 kg (95 lb) IBW/kg (Calculated) : 66.1 Heparin Dosing Weight:  43.1 kg  Vital Signs: Temp: 97.6 F (36.4 C) (08/16 2000) Temp Source: Oral (08/16 2000) BP: 150/112 (08/16 2000) Pulse Rate: 75 (08/16 2000)  Labs: Recent Labs    07/31/20 0635 08/01/20 0152 08/01/20 1542 08/01/20 2146  HGB 16.3 13.3  --   --   HCT 49.7 40.6  --   --   PLT 271 224  --   --   HEPARINUNFRC  --  0.98* 0.15* <0.10*  CREATININE 1.76* 1.57*  --   --     Estimated Creatinine Clearance: 29.4 mL/min (A) (by C-G formula based on SCr of 1.57 mg/dL (H)).  Assessment: Anticoag: New DVT. F/u oral AC. Prob going to be apixaban Ddimer 3.67 - HL 0.98>>0.15 major drop? Initial HL may have been elevated from bolus effect. RN reports no infusion problems.  8/16 PM update:  -Heparin level now low -Previously high level likely from bolus and previous subcutaneous heparin in low body weight patient  Goal of Therapy:  Heparin level 0.3-0.7 units/ml Monitor platelets by anticoagulation protocol: Yes   Plan:  Increase heparin infusion to 750 units/hr Recheck heparin level in 6-8 hours  9/16, PharmD, BCPS Clinical Pharmacist Phone: [phone]

## 2020-08-01 NOTE — Evaluation (Addendum)
Occupational Therapy Evaluation Patient Details Name: Todd Farrell MRN: [id number] DOB: [birth year] Today's Date: 08/01/2020    History of Present Illness Pt adm 8/12 with sepsis due to combined Covid PNA as well as UTI. Pt also with acute metabolic encephalopathy and DKA. Pt found to have RLE DVT on 8/15 and heparin initiated. PMH -  DM, ckd, BPH   Clinical Impression   PTA, pt was living with his wife who recently passed away; pt reporting he was independent and has daughter's who can assist at home. Pt currently requiring Min A for UB ADLs, Mod A for LB ADLs, and Min-Mod A for functional mobility with RW. Pt presenting with significant fatigue, decreased strength, poor balance, and slow processing. Pt requiring two seated rest breaks (~5 min each) during mobility to/from door and bed. Pt HR elevating quickly to 129 with activity. SpO2 >90% on 2L. Pt would benefit from further acute OT to facilitate safe dc. Recommend dc to SNF for further OT to optimize safety, independence with ADLs, and return to PLOF.     Follow Up Recommendations  SNF;Supervision/Assistance - 24 hour (May progress to home with 24/7)    Equipment Recommendations  Other (comment) (Defer to next venue)    Recommendations for Other Services PT consult;Speech consult     Precautions / Restrictions Precautions Precautions: Fall      Mobility Bed Mobility Overal bed mobility: Needs Assistance Bed Mobility: Supine to Sit;Sit to Supine     Supine to sit: Supervision Sit to supine: Supervision   General bed mobility comments: Close supervision for safety and Min cues for sequencing. Sitting rest break at EOB.   Transfers Overall transfer level: Needs assistance Equipment used: Rolling walker (2 wheeled) Transfers: Sit to/from Stand Sit to Stand: Min assist;Mod assist;Min guard         General transfer comment: Cues for hand placement. Min Guard A from EOB. Min A for power up from chair. Requiring Mod A to  correct posterior lean after standing from chair    Balance Overall balance assessment: Needs assistance Sitting-balance support: No upper extremity supported;Feet supported Sitting balance-Leahy Scale: Fair     Standing balance support: Bilateral upper extremity supported;During functional activity Standing balance-Leahy Scale: Poor Standing balance comment: Reliant on physican A and UE support                           ADL either performed or assessed with clinical judgement   ADL Overall ADL's : Needs assistance/impaired Eating/Feeding: Set up;Sitting;Supervision/ safety Eating/Feeding Details (indicate cue type and reason): Difficulty controlling breathing patterns while drinking Grooming: Min guard;Sitting   Upper Body Bathing: Minimal assistance;Sitting   Lower Body Bathing: Moderate assistance;Sit to/from stand   Upper Body Dressing : Minimal assistance;Sitting   Lower Body Dressing: Moderate assistance;Sit to/from stand Lower Body Dressing Details (indicate cue type and reason): Donning socks at bed level due tp pain at greater toes. Pt then readjusting his socks while supine in bed by bringing ankles to knees Toilet Transfer: Minimal assistance;Ambulation;RW;Moderate assistance (simulated to chair) Toilet Transfer Details (indicate cue type and reason): Min A to power up from chair. Requiring Mod A for posterior lean upon initial stand.         Functional mobility during ADLs: Minimal assistance;Moderate assistance;Rolling walker General ADL Comments: Pt presenting with decreased strength, balance, activity tolerance, and cognition. Pt very motivated to participate in therapy despite significant fatigue.      Vision  Perception     Praxis      Pertinent Vitals/Pain Pain Assessment: No/denies pain     Hand Dominance  (Amb)   Extremity/Trunk Assessment Upper Extremity Assessment Upper Extremity Assessment: Generalized weakness   Lower  Extremity Assessment Lower Extremity Assessment: Defer to PT evaluation   Cervical / Trunk Assessment Cervical / Trunk Assessment: Kyphotic   Communication Communication Communication: No difficulties   Cognition Arousal/Alertness: Awake/alert Behavior During Therapy: WFL for tasks assessed/performed Overall Cognitive Status: No family/caregiver present to determine baseline cognitive functioning Area of Impairment: Following commands;Safety/judgement;Awareness;Problem solving                       Following Commands: Follows one step commands with increased time;Follows multi-step commands inconsistently Safety/Judgement: Decreased awareness of safety Awareness: Emergent Problem Solving: Slow processing General Comments: Slightly tangiental. Pt following simple commands. Pt requiring significant time throughout. Need to further assess cognition and safety with IADLs.   General Comments  BP supine 151/82. SpO2 100% on 2L. HR elevating to 129 with mobility and pt significantly fatigued. Requiring two seated rest breaks during mobility to/from door.    Exercises Exercises: Other exercises Other Exercises Other Exercises: Purse lip breathing techniques   Shoulder Instructions      Home Living Family/patient expects to be discharged to:: Private residence Living Arrangements: Alone Available Help at Discharge: Family;Available 24 hours/day (States his daughters can come stay with him) Type of Home: Apartment Home Access: Level entry     Home Layout: One level     Bathroom Shower/Tub: Chief Strategy Officer: Standard     Home Equipment: Environmental consultant - 2 wheels          Prior Functioning/Environment Level of Independence: Independent                 OT Problem List: Decreased strength;Decreased range of motion;Decreased activity tolerance;Impaired balance (sitting and/or standing);Decreased knowledge of use of DME or AE;Decreased knowledge of  precautions;Cardiopulmonary status limiting activity;Decreased cognition;Decreased safety awareness      OT Treatment/Interventions: Self-care/ADL training;Therapeutic exercise;Energy conservation;DME and/or AE instruction;Therapeutic activities;Patient/family education    OT Goals(Current goals can be found in the care plan section) Acute Rehab OT Goals Patient Stated Goal: Get better OT Goal Formulation: With patient Time For Goal Achievement: 08/15/20 Potential to Achieve Goals: Good  OT Frequency: Min 2X/week   Barriers to D/C:            Co-evaluation              AM-PAC OT "6 Clicks" Daily Activity     Outcome Measure Help from another person eating meals?: A Little Help from another person taking care of personal grooming?: A Little Help from another person toileting, which includes using toliet, bedpan, or urinal?: A Lot Help from another person bathing (including washing, rinsing, drying)?: A Lot Help from another person to put on and taking off regular upper body clothing?: A Little Help from another person to put on and taking off regular lower body clothing?: A Lot 6 Click Score: 15   End of Session Equipment Utilized During Treatment: Oxygen (2 L) Nurse Communication: Mobility status  Activity Tolerance: Patient limited by fatigue Patient left: in bed;with call bell/phone within reach;with bed alarm set  OT Visit Diagnosis: Unsteadiness on feet (R26.81);Other abnormalities of gait and mobility (R26.89);Muscle weakness (generalized) (M62.81);Other symptoms and signs involving cognitive function  Time: 7342-8768 OT Time Calculation (min): 51 min Charges:  OT General Charges $OT Visit: 1 Visit OT Evaluation $OT Eval Moderate Complexity: 1 Mod OT Treatments $Self Care/Home Management : 23-37 mins  Reine Bristow MSOT, OTR/L Acute Rehab Pager: [phone] Office: [phone]  Theodoro Grist Fannye Myer 08/01/2020, 4:51 PM

## 2020-08-01 NOTE — Progress Notes (Signed)
PT Cancellation Note  Patient Details Name: Todd Farrell MRN: [id number] DOB: Oct 01, 1956   Cancelled Treatment:    Reason Eval/Treat Not Completed: Other (comment). Pt fatigued after OT session. Will follow up tomorrow.    [name] [facility] 08/01/2020, 3:11 PM Skip Mayer PT Acute Rehabilitation Services Pager [phone] Office [phone]

## 2020-08-01 NOTE — Progress Notes (Signed)
Inpatient Diabetes Program Recommendations  AACE/ADA: New Consensus Statement on Inpatient Glycemic Control (2015)  Target Ranges:  Prepandial:   less than 140 mg/dL      Peak postprandial:   less than 180 mg/dL (1-2 hours)      Critically ill patients:  140 - 180 mg/dL   Lab Results  Component Value Date   GLUCAP 296 (H) 08/01/2020   HGBA1C 14.0 (H) 07/28/2020    Review of Glycemic Control Results for Todd Farrell, Todd Farrell (MRN [id number]) as of 08/01/2020 13:08  Ref. Range 07/31/2020 07:33 07/31/2020 10:08 07/31/2020 11:54 07/31/2020 16:43 07/31/2020 22:05 08/01/2020 08:16 08/01/2020 12:06  Glucose-Capillary Latest Ref Range: 70 - 99 mg/dL 97 409 (H) 811 (H) 914 (H) 196 (H) 249 (H) 296 (H)   Diabetes history: DM 2 Outpatient Diabetes medications: insulin at home Current orders for Inpatient glycemic control:  Novolog 0-15 units tid  Nepro bid between meals Decadron 6 mg Q24 hours  Inpatient Diabetes Program Recommendations:    -  Consider Lantus 5 units  Spoke with pt over the phone regarding glucose control.  Pt reports using the [facility] pharmacy in [place] to fill prescriptions, he could not remember the name of the insulins but told me he took a daytime and nighttime insulin and the nighttime insulin was 7 units.  Called [facility] pharmacy past prescription from [facility] last filled in 2015, Lantus 7 units qhs, and Humalog 3 units tid with meals.  Unsure how pt is receiving these medications as he stated "he does not have a PCP". Noted No insurance on file.  As pt lives in [place] he will have to get established with the [facility] clinic for assistance with PCP and meds if no insurance.  Reinforced the need for follow up with PCP, taking insulin as prescribed, checking glucose frequently when going home. Discussed with pt what to do for low blood sugar at home.  Thanks,  Christena Deem RN, MSN, BC-ADM Inpatient Diabetes Coordinator Team Pager [phone] (8a-5p)

## 2020-08-01 NOTE — Progress Notes (Signed)
[facility] Progress Note  Patient: Todd Farrell    JQZ:[id number]  DOA: 08/13/2020     Date of Service: the patient was seen and examined on 08/01/2020  Brief hospital course: diabetes mellitus type 2, hyperlipidemia, vitamin D deficiency, benign prostatic hyperplasia, chronic kidney disease stage IIIb. Presents with DKA, sepsis secondary to UTI and pneumonia as well as COVID-19. Currently plan is continue antibiotics.  Assessment and Plan: 1.  Sepsis due to combined community-acquired pneumonia as well as UTI and bacteremia with E. coli. Also secondary to COVID-19. POA. Acute sinusitis Met SIRS criteria with lactic acidosis, encephalopathy, tachycardia, hypothermia as well as leukocytosis. Treated aggressively with IV fluids. Currently sepsis physiology has resolved. Lactic acidosis has resolved as well. Blood cultures positive for E. coli. Continuing IV ceftriaxone. CT shows evidence of sinusitis as well as patient has pneumonia on the chest x-ray therefore we will continue azithromycin. Monitor response to treatment.  2.  Acute metabolic encephalopathy In the setting of sepsis and DKA. Currently getting better. Initially was NPO. Currently will initiate dysphagia 1 diet. Speech therapy consulted. CT scan negative for any acute stroke. Metabolic work-up unremarkable.  3.  DKA Uncontrolled type 2 diabetes mellitus with hyperglycemia with renal complication Presented with hyperglycemia, anion gap acidosis as well as elevated beta hydroxybutyric acid. Treated with IV insulin. Currently on basal bolus regimen. Monitor.  4.  Acute hypoxic respiratory failure. Acute COVID-19 Viral Pneumonia Lab Results  Component Value Date   SARSCOV2NAA POSITIVE (A) 08/12/2020   CXR: hazy bilateral peripheral opacities CT chest: GGO, consolidation Recent Labs    07/31/20 0635 08/01/20 0152  DDIMER 8.24* 3.67*  FERRITIN 1,229* 971*  CRP 8.6* 4.5*   Tmax last 24 hours:  Temp  (24hrs), Avg:98.1 F (36.7 C), Min:97.4 F (36.3 C), Max:98.6 F (37 C)  Oxygen requirements: 3 LPM.  Antibiotics: Ceftriaxone and azithromycin for UTI and pneumonia Vitamin C and Zinc: Currently n.p.o. DVT Prophylaxis:  Remdesivir: Started on 07/28/2020 Steroids: Decadron started on 07/20/2020 Baricitinib/Actemra(off-label use): Not a candidate due to sepsis and bacteremia Prone positioning: Patient encouraged to stay in prone position as much as possible.  The treatment plan and use of medications and known side effects were discussed with patient/family. It was clearly explained that there is no proven definitive treatment for COVID-19 infection yet. Complete risks and long-term side effects are unknown, however in the best clinical judgment they seem to be of some clinical benefit rather than medical risks. Patient/family agree with the treatment plan and want to receive these treatments as indicated.   5.  Hyperkalemia Acute kidney injury on chronic kidney disease stage IIIb Renal function actually improving. Continue with IV hydration.  Monitor.  Potassium also improving.  Monitor.  6.  Severe protein calorie malnutrition Failure to thrive Body mass index is 14.88 kg/m.  Dietary consulted. Monitor. At risk for poor outcome.  7.  Bilateral great toe ulceration Suspect in the setting of PVD. Arterial study shows bilateral obstruction. Vascular surgery consult appreciated. Currently on antibiotic. Does not appear acutely infected  8.  Poor IV access. Difficult to obtain labs.  9. Prolonged QTC Hypokalemia Potassium corrected. Unable to obtain labs to verify. Continue to monitor.  10 RPR reactive HIV negative Done as a part of encephalopathy work-up. Currently T. pallidum test pending.  Monitor.  11.  Vomiting. X-ray abdomen shows no acute abnormality.  Needs no further work-up.  12.  DVT Right posterior tibial acute DVT. This appears to be distal DVT at present  given that the patient has limited mobility currently think the patient will remain at high risk for progression of this DVT and therefore strategy of monitoring progression with serial Dopplers will not be successful. We will recommend heparin for DVT treatment for now.  13.  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 Patient denies any acute pain in his legs right now. Suspect this is chronic in nature. Vascular surgery will be consulted. Currently on heparin.  14 social issue. Patient has informed me to contact the daughter over her sister. Sister has reported that patient only resume care when sister called police to check on him. Social worker consulted currently.  Diet: Dysphagia 1 diet DVT Prophylaxis:       Advance goals of care discussion: Full code  Family Communication: no family was present at bedside, at the time of interview.  The pt provided permission to discuss medical plan with the family.  Discussed with patient's daughter per patient's request. opportunity was given to ask question and all questions were answered satisfactorily.   Disposition:  Status is: Inpatient  Remains inpatient appropriate because:IV treatments appropriate due to intensity of illness or inability to take PO   Dispo: The patient is from: Home              Anticipated d/c is to: SNF              Anticipated d/c date is: 3 days              Patient currently is not medically stable to d/c.  Subjective: No nausea no vomiting.  No fever no chills.  Physical Exam:  General: Appear in mild distress, no Rash; Oral Mucosa Clear, moist. no Abnormal Neck Mass Or lumps, Conjunctiva normal  Cardiovascular: S1 and S2 Present, no Murmur, Respiratory: good respiratory effort, Bilateral Air entry present and bilateral  Crackles, no wheezes Abdomen: Bowel Sound present, Soft and no tenderness Extremities: no  Pedal edema, no calf tenderness Neurology: alert and oriented to time, place, and person affect appropriate. no new focal deficit Gait not checked due to patient safety concerns  Vitals:   07/31/20 2000 08/01/20 0400 08/01/20 1210 08/01/20 1517  BP: (!) 141/89 127/78 (!) 145/88 127/87  Pulse: 96 89 91 98  Resp: '18 18  18  ' Temp: 97.7 F (36.5 C) 98.5 F (36.9 C) 98.6 F (37 C) (!) 97.4 F (36.3 C)  TempSrc: Oral Oral Oral Axillary  SpO2: 90% 94% 100% 100%  Weight:      Height:        Intake/Output Summary (Last 24 hours) at 08/01/2020 1854 Last data filed at 08/01/2020 1700 Gross per 24 hour  Intake 3091.93 ml  Output 1450 ml  Net 1641.93 ml   Filed Weights   07/28/20 0012  Weight: 43.1 kg    Data Reviewed: I have personally reviewed and interpreted daily labs, tele strips, imagings as discussed above. I reviewed all nursing notes, pharmacy notes, vitals, pertinent old records I have discussed plan of care as described above with RN and patient/family.  CBC: Recent Labs  Lab 07/29/2020 2329 07/28/20 0205 07/28/20 1701 07/31/20 0635 08/01/20 0152  WBC 15.9*  --  17.8* 15.6* 10.9*  NEUTROABS 14.6*  --  16.8* 13.9* 9.3*  HGB 18.2* 16.3 14.8 16.3 13.3  HCT 59.2* 48.0 45.8 49.7 40.6  MCV 91.4  --  86.1 87.8 86.9  PLT  360  --  269 271 488   Basic Metabolic Panel: Recent Labs  Lab 07/28/20 1701 07/28/20 2212 07/29/20 0226 07/31/20 0635 08/01/20 0152  NA 145 145 145 148* 143  K 4.2 3.8 4.1 3.4* 3.4*  CL 105 116* 105 109 108  CO2 28 20* '27 24 23  ' GLUCOSE 109* 300* 296* 96 261*  BUN 95* 71* 90* 56* 45*  CREATININE 2.75* 1.93* 2.56* 1.76* 1.57*  CALCIUM 8.0* 5.5* 8.0* 8.0* 7.0*  MG  --   --   --  1.5* 1.7    Studies: No results found.  Scheduled Meds: . albuterol  2 puff Inhalation Q6H  . amoxicillin-clavulanate  1 tablet Oral Q12H  . dexamethasone (DECADRON) injection  6 mg Intravenous Q24H  . feeding supplement (NEPRO CARB STEADY)  237 mL Oral BID BM    . insulin aspart  0-15 Units Subcutaneous TID WC  . multivitamin with minerals  1 tablet Oral Daily   Continuous Infusions: . heparin 650 Units/hr (08/01/20 1700)  . lactated ringers 125 mL/hr at 08/01/20 1700   PRN Meds: acetaminophen, polyethylene glycol  Time spent: 35 minutes  Author: Berle Mull, MD [facility] 08/01/2020 6:54 PM  To reach On-call, see care teams to locate the attending and reach out via [email or web address]. Between 7PM-7AM, please contact night-coverage If you still have difficulty reaching the attending provider, please page the [facility] (Director on Call) for [facility] on amion for assistance.

## 2020-08-01 NOTE — Discharge Instructions (Signed)
Your Diabetes level (Hemoglobin A1c) was 14%. Goal is to be around a 7% (150 or less when you stick your finger to check your sugar levels.)

## 2020-08-01 NOTE — Progress Notes (Signed)
CSW received call from patient's sister, Boykin Reaper. She requested help in obtaining Durable POA paperwork. CSW explained that the hospital is unable to assist with Durable POA, due to conflict of interest, but that we can try to complete Healthcare POA if Chaplains are abe to enter COVID rooms. She reported understanding and stated that she is trying to help pay patient's bills as he asked her to. She also requested assistance obtaining a Guardian for patient as she expressed concern that his children are denying him medical care (his life partner passed away this weekend and she believes they did not allow her to receive care either). Patient only received care when sister called Police to check on him. CSW made her aware that patient is documented as alert and oriented x4 and therefore is technically his own decision-maker. He would only qualify for a Guardian by the courts if he were deemed to lack capacity or competency. She stated understanding. CSW emailed financial counseling to see if they can assist patient in applying for Medicaid as patient's sister believes he qualifies.   Adolphus Hanf LCSW

## 2020-08-01 NOTE — Progress Notes (Signed)
ANTICOAGULATION CONSULT NOTE - Follow Up Consult  Pharmacy Consult for Heparin Indication: DVT  No Known Allergies  Patient Measurements: Height: 5\' 7"  (170.2 cm) Weight: 43.1 kg (95 lb) IBW/kg (Calculated) : 66.1 Heparin Dosing Weight:  43.1 kg  Vital Signs: Temp: 97.4 F (36.3 C) (08/16 1517) Temp Source: Axillary (08/16 1517) BP: 127/87 (08/16 1517) Pulse Rate: 98 (08/16 1517)  Labs: Recent Labs    07/31/20 0635 08/01/20 0152 08/01/20 1542  HGB 16.3 13.3  --   HCT 49.7 40.6  --   PLT 271 224  --   HEPARINUNFRC  --  0.98* 0.15*  CREATININE 1.76* 1.57*  --     Estimated Creatinine Clearance: 29.4 mL/min (A) (by C-G formula based on SCr of 1.57 mg/dL (H)).  Assessment: Anticoag: New DVT. F/u oral AC. Prob going to be apixaban Ddimer 3.67 - HL 0.98>>0.15 major drop? Initial HL may have been elevated from bolus effect. RN reports no infusion problems.  Goal of Therapy:  Heparin level 0.3-0.7 units/ml Monitor platelets by anticoagulation protocol: Yes   Plan:  Increase heparin infusion to 650 units/hr Recheck in 6 hrs.   Ilay Capshaw S. 08/03/20, PharmD, BCPS Clinical Staff Pharmacist [email or web address] Merilynn Finland, Hartlee Amedee Stillinger 08/01/2020,4:29 PM

## 2020-08-01 NOTE — Consult Note (Signed)
Hospital Consult    Reason for Consult: PVD with gangrene left great toe Referring Physician: Hospitalist MRN #:  [id number]  History of Present Illness: This is a 64 y.o. male with history of diabetes, hyperlipidemia, stage III CKD who vascular surgery has been consulted for gangrenous changes to the left great toe.  Patient was reportedly admitted with DKA and sepsis secondary to UTI and COVID-19 pneumonia.  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  On the left was noted to have biphasic waveforms down to the TP trunk and then a monophasic runoff in the AT and occluded peroneal and posterior tibial artery.  Also had a DVT in the right posterior tibial vein.    He states that he has had wounds to both great toes for at least 6 months.  He describes 6 months of numbness in his feet that has been mild.  This has been stable and not worsening.  Denies any motor deficits.  Feels he can move his foot fine.  States he is ambulatory.  Denies using tobacco and alcohol.  Denies any previous lower extremity revascularizations.    Past Medical History:  Diagnosis Date   BPH (benign prostatic hyperplasia) 07/28/2020   Mixed diabetic hyperlipidemia associated with type 2 diabetes mellitus (HCC) 07/28/2020   Vitamin D deficiency 07/28/2020    History reviewed. No pertinent surgical history.  No Known Allergies  Prior to Admission medications   Medication Sig Start Date End Date Taking? Authorizing Provider  PRESCRIPTION MEDICATION Unknown Insulin   Yes [provider]    Social History   Socioeconomic History   Marital status: Single    Spouse name: Not on file   Number of children: Not on file   Years of education: Not on file   Highest education level: Not on file  Occupational History   Not on file  Tobacco Use    Smoking status: Never Smoker   Smokeless tobacco: Never Used  Vaping Use   Vaping Use: Never used  Substance and Sexual Activity   Alcohol use: Not Currently   Drug use: Not Currently   Sexual activity: Not on file  Other Topics Concern   Not on file  Social History Narrative   Not on file   Social Determinants of Health   Financial Resource Strain:    Difficulty of Paying Living Expenses:   Food Insecurity:    Worried About Running Out of Food in the Last Year:    Barista in the Last Year:   Transportation Needs:    Freight forwarder (Medical):    Lack of Transportation (Non-Medical):   Physical Activity:    Days of Exercise per Week:    Minutes of Exercise per Session:   Stress:    Feeling of Stress :   Social Connections:    Frequency of Communication with Friends and Family:    Frequency of Social Gatherings with Friends and Family:    Attends Religious Services:    Active Member of Clubs or Organizations:    Attends Engineer, structural:    Marital Status:   Intimate Partner Violence:    Fear of Current or Ex-Partner:    Emotionally Abused:    Physically Abused:    Sexually Abused:      Family History  Family history unknown: Yes  ROS: [x]  Positive   [ ]  Negative   [ ]  All sytems reviewed and are negative  Cardiovascular: []  chest pain/pressure []  palpitations []  SOB lying flat []  DOE []  pain in legs while walking []  pain in legs at rest []  pain in legs at night []  non-healing ulcers []  hx of DVT []  swelling in legs  Pulmonary: []  productive cough []  asthma/wheezing []  home O2  Neurologic: []  weakness in []  arms []  legs []  numbness in []  arms []  legs []  hx of CVA []  mini stroke [] difficulty speaking or slurred speech []  temporary loss of vision in one eye []  dizziness  Hematologic: []  hx of cancer []  bleeding problems []  problems with blood clotting easily  Endocrine:   []  diabetes []   thyroid disease  GI []  vomiting blood []  blood in stool  GU: []  CKD/renal failure []  HD--[]  M/W/F or []  T/T/S []  burning with urination []  blood in urine  Psychiatric: []  anxiety []  depression  Musculoskeletal: []  arthritis []  joint pain  Integumentary: []  rashes []  ulcers  Constitutional: []  fever []  chills   Physical Examination  Vitals:   08/01/20 0400 08/01/20 1210  BP: 127/78 (!) 145/88  Pulse: 89 91  Resp: 18   Temp: 98.5 F (36.9 C) 98.6 F (37 C)  SpO2: 94% 100%   Body mass index is 14.88 kg/m.  General:  NAD Gait: Not observed HENT: WNL, normocephalic Pulmonary: normal non-labored breathing, without Rales, rhonchi,  wheezing Cardiac: regular, without  Murmurs, rubs or gallops Abdomen: soft, NT/ND, no masses Skin: without rashes Vascular Exam/Pulses: Palpable femoral pulses bilaterally Popliteal popliteal pulses bilaterally No palpable pedal pulses Open clean ulcer to the right great toe with part of toenail off Dry gangrene to the left great and second toe Extremities: Ischemic changes to bilateral toes as noted above Musculoskeletal: no muscle wasting or atrophy  Neurologic: A&O X 3; Appropriate Affect ; SENSATION: normal; MOTOR FUNCTION:  moving all extremities equally. Speech is fluent/normal   CBC    Component Value Date/Time   WBC 10.9 (H) 08/01/2020 0152   RBC 4.67 08/01/2020 0152   HGB 13.3 08/01/2020 0152   HCT 40.6 08/01/2020 0152   PLT 224 08/01/2020 0152   MCV 86.9 08/01/2020 0152   MCH 28.5 08/01/2020 0152   MCHC 32.8 08/01/2020 0152   RDW 13.1 08/01/2020 0152   LYMPHSABS 0.7 08/01/2020 0152   MONOABS 0.7 08/01/2020 0152   EOSABS 0.0 08/01/2020 0152   BASOSABS 0.0 08/01/2020 0152    BMET    Component Value Date/Time   NA 143 08/01/2020 0152   K 3.4 (L) 08/01/2020 0152   CL 108 08/01/2020 0152   CO2 23 08/01/2020 0152   GLUCOSE 261 (H) 08/01/2020 0152   BUN 45 (H) 08/01/2020 0152   CREATININE 1.57 (H) 08/01/2020  0152   CALCIUM 7.0 (L) 08/01/2020 0152   GFRNONAA 46 (L) 08/01/2020 0152   GFRAA 54 (L) 08/01/2020 0152    COAGS: Lab Results  Component Value Date   INR 1.1 07/28/2020     Non-Invasive Vascular Imaging:    Bilateral lower extremity arterial duplex 07/31/20: 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  On the left was noted to have biphasic waveforms down to the TP trunk and then a monophasic runoff in the AT and occluded peroneal and posterior tibial artery.    Lower extremity venous duplex 07/31/20: DVT in the right posterior tibial vein.  ASSESSMENT/PLAN: This is a 64 y.o. male with history of diabetes, hyperlipidemia, stage III CKD who vascular surgery was consulted for gangrenous changes to the left great toe.  On exam he actually has ulcerations/dry gangrene to bilateral great toes as well as the left second toe.  He states these tissue changes have been present for at least 6 months and this appears very chronic on exam with dry gangrene and no active signs of infection.  Discussed that he would benefit from lower extremity arteriogram and possible intervention and would need staged intervention on both legs.  Initial focus would be on the left leg given worse tissue loss.  Suspect he has bilateral tibial disease given he has have palpable popliteal pulses on exam.  His renal function is recovering and would need to use some contrast to evaluate for tibial disease.  Will allow him to continue recovering from sepsis with Covid pneumonia and UTI.  I will also clarify the current policy of the cath lab given his covid positive status.  Again I think on exam appears chronic and no active signs of infection at this time.       Cephus Shelling, MD [facility] Office: [phone]  Cephus Shelling

## 2020-08-01 NOTE — Progress Notes (Signed)
  Speech Language Pathology Treatment: Dysphagia  Patient Details Name: Todd Farrell MRN: [id number] DOB: 05/16/1956 Today's Date: 08/01/2020 Time: 6283-1517 SLP Time Calculation (min) (ACUTE ONLY): 11 min  Assessment / Plan / Recommendation Clinical Impression  Pt was seen for dysphagia therapy for possible upgrade from puree. Despite missing all but one lower tooth, his efficiency of mastication and transit with mechanical soft was functional. He needed mild cues to break for respirations during straw sips thin although did not become dyspneic. No s/s aspiration and diet texture upgraded to Dys 3 and continue thin. Likely will be upgrade to regular soon.    HPI HPI: Patient is a 64 y.o. male with PMH: DM-2, HLD, vitamin D deficiency, benign prostate hyperplasia, chronic kidney disease stage IIIb who prestented to hospital via EMS secondary to family concerned he was becoming confused. He was hypothermic and hypoxic upon ED admission, with multiple SIRS criteria and was found to be Covid positive with left lower lobe infiltrate.      SLP Plan  Continue with current plan of care       Recommendations  Diet recommendations: Dysphagia 3 (mechanical soft);Thin liquid Liquids provided via: Straw Medication Administration: Whole meds with liquid Supervision: Patient able to self feed Compensations: Slow rate;Small sips/bites Postural Changes and/or Swallow Maneuvers: Seated upright 90 degrees                Oral Care Recommendations: Oral care BID Follow up Recommendations: None SLP Visit Diagnosis: Dysphagia, unspecified (R13.10) Plan: Continue with current plan of care                      Royce Macadamia 08/01/2020, 4:51 PM Breck Coons Lonell Face.Ed Nurse, children's [phone] Office [phone]

## 2020-08-02 ENCOUNTER — Inpatient Hospital Stay (HOSPITAL_COMMUNITY): Payer: HRSA Program

## 2020-08-02 DIAGNOSIS — U071 COVID-19: Secondary | ICD-10-CM | POA: Diagnosis not present

## 2020-08-02 DIAGNOSIS — L97509 Non-pressure chronic ulcer of other part of unspecified foot with unspecified severity: Secondary | ICD-10-CM

## 2020-08-02 DIAGNOSIS — A4189 Other specified sepsis: Secondary | ICD-10-CM | POA: Diagnosis not present

## 2020-08-02 DIAGNOSIS — L0390 Cellulitis, unspecified: Secondary | ICD-10-CM

## 2020-08-02 LAB — HEPARIN LEVEL (UNFRACTIONATED)
Heparin Unfractionated: <0.1 IU/mL — ABNORMAL LOW (ref 0.30–0.70)
Heparin Unfractionated: <0.1 IU/mL — ABNORMAL LOW (ref 0.30–0.70)

## 2020-08-02 LAB — CBC WITH DIFFERENTIAL/PLATELET
Abs Immature Granulocytes: 0.32 10*3/uL — ABNORMAL HIGH (ref 0.00–0.07)
Basophils Absolute: 0 10*3/uL (ref 0.0–0.1)
Basophils Relative: 0 %
Eosinophils Absolute: 0 10*3/uL (ref 0.0–0.5)
Eosinophils Relative: 0 %
HCT: 41.3 % (ref 39.0–52.0)
Hemoglobin: 13.7 g/dL (ref 13.0–17.0)
Immature Granulocytes: 3 %
Lymphocytes Relative: 10 %
Lymphs Abs: 1.1 10*3/uL (ref 0.7–4.0)
MCH: 29 pg (ref 26.0–34.0)
MCHC: 33.2 g/dL (ref 30.0–36.0)
MCV: 87.5 fL (ref 80.0–100.0)
Monocytes Absolute: 0.6 10*3/uL (ref 0.1–1.0)
Monocytes Relative: 5 %
Neutro Abs: 9.5 10*3/uL — ABNORMAL HIGH (ref 1.7–7.7)
Neutrophils Relative %: 82 %
Platelets: 217 10*3/uL (ref 150–400)
RBC: 4.72 MIL/uL (ref 4.22–5.81)
RDW: 13.2 % (ref 11.5–15.5)
WBC: 11.6 10*3/uL — ABNORMAL HIGH (ref 4.0–10.5)
nRBC: 0.3 % — ABNORMAL HIGH (ref 0.0–0.2)

## 2020-08-02 LAB — COMPREHENSIVE METABOLIC PANEL
ALT: 15 U/L (ref 0–44)
AST: 27 U/L (ref 15–41)
Albumin: 1.6 g/dL — ABNORMAL LOW (ref 3.5–5.0)
Alkaline Phosphatase: 81 U/L (ref 38–126)
Anion gap: 12 (ref 5–15)
BUN: 39 mg/dL — ABNORMAL HIGH (ref 8–23)
CO2: 22 mmol/L (ref 22–32)
Calcium: 7.4 mg/dL — ABNORMAL LOW (ref 8.9–10.3)
Chloride: 108 mmol/L (ref 98–111)
Creatinine, Ser: 1.58 mg/dL — ABNORMAL HIGH (ref 0.61–1.24)
GFR calc Af Amer: 53 mL/min — ABNORMAL LOW (ref >60)
GFR calc non Af Amer: 46 mL/min — ABNORMAL LOW (ref >60)
Glucose, Bld: 265 mg/dL — ABNORMAL HIGH (ref 70–99)
Potassium: 3.9 mmol/L (ref 3.5–5.1)
Sodium: 142 mmol/L (ref 135–145)
Total Bilirubin: 0.7 mg/dL (ref 0.3–1.2)
Total Protein: 5.2 g/dL — ABNORMAL LOW (ref 6.5–8.1)

## 2020-08-02 LAB — GLUCOSE, CAPILLARY
Glucose-Capillary: 204 mg/dL — ABNORMAL HIGH (ref 70–99)
Glucose-Capillary: 194 mg/dL — ABNORMAL HIGH (ref 70–99)
Glucose-Capillary: 131 mg/dL — ABNORMAL HIGH (ref 70–99)
Glucose-Capillary: 142 mg/dL — ABNORMAL HIGH (ref 70–99)

## 2020-08-02 LAB — D-DIMER, QUANTITATIVE: D-Dimer, Quant: 3.2 ug/mL-FEU — ABNORMAL HIGH (ref 0.00–0.50)

## 2020-08-02 LAB — FERRITIN: Ferritin: 1045 ng/mL — ABNORMAL HIGH (ref 24–336)

## 2020-08-02 LAB — C-REACTIVE PROTEIN: CRP: 6.4 mg/dL — ABNORMAL HIGH (ref <1.0)

## 2020-08-02 LAB — MAGNESIUM: Magnesium: 1.4 mg/dL — ABNORMAL LOW (ref 1.7–2.4)

## 2020-08-02 MED ORDER — HEPARIN (PORCINE) 25000 UT/250ML-% IV SOLN
850.0000 [IU]/h | INTRAVENOUS | Status: DC
  Administered 2020-08-02: 850 [IU]/h via INTRAVENOUS

## 2020-08-02 MED ORDER — HEPARIN BOLUS VIA INFUSION
1500.0000 [IU] | Freq: Once | INTRAVENOUS | Status: AC
  Administered 2020-08-02: 1500 [IU] via INTRAVENOUS
  Filled 2020-08-02: qty 1500

## 2020-08-02 MED ORDER — DEXAMETHASONE 6 MG PO TABS
6.0000 mg | ORAL_TABLET | Freq: Every day | ORAL | Status: DC
  Administered 2020-08-02 – 2020-08-06 (×5): 6 mg via ORAL
  Filled 2020-08-02 (×5): qty 1

## 2020-08-02 MED ORDER — AMOXICILLIN-POT CLAVULANATE 500-125 MG PO TABS
1.0000 | ORAL_TABLET | Freq: Two times a day (BID) | ORAL | Status: AC
  Administered 2020-08-02 – 2020-08-06 (×8): 500 mg via ORAL
  Filled 2020-08-02 (×9): qty 1

## 2020-08-02 MED ORDER — HEPARIN (PORCINE) 25000 UT/250ML-% IV SOLN
1200.0000 [IU]/h | INTRAVENOUS | Status: DC
  Administered 2020-08-03: 1000 [IU]/h via INTRAVENOUS
  Administered 2020-08-04: 1200 [IU]/h via INTRAVENOUS
  Filled 2020-08-02 (×2): qty 250

## 2020-08-02 NOTE — Progress Notes (Signed)
ANTICOAGULATION CONSULT NOTE - Follow Up Consult  Pharmacy Consult for Heparin Indication: DVT  No Known Allergies  Patient Measurements: Height: 5\' 7"  (170.2 cm) Weight: 43.1 kg (95 lb) IBW/kg (Calculated) : 66.1 Heparin Dosing Weight:  43.1 kg  Vital Signs: Temp: 98.2 F (36.8 C) (08/17 2019) Temp Source: Oral (08/17 2019) BP: 139/79 (08/17 2019) Pulse Rate: 78 (08/17 2019)  Labs: Recent Labs    07/31/20 0635 07/31/20 0635 08/01/20 0152 08/01/20 1542 08/01/20 2146 08/02/20 0500 08/02/20 0700 08/02/20 2001  HGB 16.3   < > 13.3  --   --  13.7  --   --   HCT 49.7  --  40.6  --   --  41.3  --   --   PLT 271  --  224  --   --  217  --   --   HEPARINUNFRC  --   --  0.98*   < > <0.10*  --  <0.10* <0.10*  CREATININE 1.76*  --  1.57*  --   --  1.58*  --   --    < > = values in this interval not displayed.    Estimated Creatinine Clearance: 29.2 mL/min (A) (by C-G formula based on SCr of 1.58 mg/dL (H)).  Assessment: 83 yoM admitted with sepsis and COVID found to have new DVT and started on IV heparin.   Heparin level remains undetectable, per nursing no infusion issues.  Goal of Therapy:  Heparin level 0.3-0.7 units/ml Monitor platelets by anticoagulation protocol: Yes   Plan:  Heparin 1500 units x1 then increase to 1000 units/h Recheck heparin level in 6h   64, PharmD, BCPS Clinical Pharmacist [phone] Please check AMION for all [facility] Pharmacy numbers 08/02/2020

## 2020-08-02 NOTE — Progress Notes (Signed)
ANTICOAGULATION CONSULT NOTE - Follow Up Consult  Pharmacy Consult for Heparin Indication: DVT  No Known Allergies  Patient Measurements: Height: 5\' 7"  (170.2 cm) Weight: 43.1 kg (95 lb) IBW/kg (Calculated) : 66.1 Heparin Dosing Weight:  43.1 kg  Vital Signs: Temp: 97.8 F (36.6 C) (08/17 0400) Temp Source: Oral (08/17 0400) BP: 137/96 (08/17 0400) Pulse Rate: 75 (08/17 0400)  Labs: Recent Labs    07/31/20 0635 07/31/20 0635 08/01/20 0152 08/01/20 0152 08/01/20 1542 08/01/20 2146 08/02/20 0500 08/02/20 0700  HGB 16.3   < > 13.3  --   --   --  13.7  --   HCT 49.7  --  40.6  --   --   --  41.3  --   PLT 271  --  224  --   --   --  217  --   HEPARINUNFRC  --   --  0.98*   < > 0.15* <0.10*  --  <0.10*  CREATININE 1.76*  --  1.57*  --   --   --  1.58*  --    < > = values in this interval not displayed.    Estimated Creatinine Clearance: 29.2 mL/min (A) (by C-G formula based on SCr of 1.58 mg/dL (H)).  Assessment: Anticoag: New DVT. F/u oral AC. Prob going to be apixaban Ddimer 3.67 - HL 0.98>>0.15 major drop? Initial HL may have been elevated from bolus effect. RN reports no infusion problems.  Heparin level came back low again this AM. Rn confirmed that it has been infusing ok. Will re-bolus him and increase rate. Hgb/plts stable.   Goal of Therapy:  Heparin level 0.3-0.7 units/ml Monitor platelets by anticoagulation protocol: Yes   Plan:  Heparin bolus 1500 units x1 Increase heparin drip to 850 units/hr F/u with 8 hr heparin level Daily HL and CBC  08/15/2020, PharmD, BCIDP, AAHIVP, CPP Infectious Disease Pharmacist 08/02/2020 10:53 AM

## 2020-08-02 NOTE — Progress Notes (Signed)
64 year old male seen yesterday for bilateral lower extremity tissue loss.  This is in the setting of E. coli bacteremia from UTI sepsis as well as Covid pneumonia.  Covid test is positive from August 06, 2020.  His tissue loss has been chronic for the last 6 months and he states has been relatively stable.  Will get ABIs today to see what his toe pressures are for baseline study given he only had arterial duplex.  He has palpable popliteal pulses and no pedal pulses and suspect he has mostly tibial disease.  Would need to use contrast to evaluate for tibial intervention and given that he is recovering from an AKI on CKD, I would give him time to continue recovering before planning intervention unless his toes worsen.  Cephus Shelling, MD [facility] [place] Office: [phone]   Cephus Shelling

## 2020-08-02 NOTE — Progress Notes (Signed)
Inpatient Diabetes Program Recommendations  AACE/ADA: New Consensus Statement on Inpatient Glycemic Control (2015)  Target Ranges:  Prepandial:   less than 140 mg/dL      Peak postprandial:   less than 180 mg/dL (1-2 hours)      Critically ill patients:  140 - 180 mg/dL   Lab Results  Component Value Date   GLUCAP 204 (H) 08/02/2020   HGBA1C 14.0 (H) 07/28/2020    Review of Glycemic Control Results for Todd Farrell, Todd Farrell (MRN [id number]) as of 08/02/2020 08:20  Ref. Range 08/01/2020 08:16 08/01/2020 12:06 08/01/2020 15:50 08/01/2020 21:00 08/02/2020 07:42  Glucose-Capillary Latest Ref Range: 70 - 99 mg/dL 329 (H) 518 (H) 841 (H) 206 (H) 204 (H)   Diabetes history: DM 2 Outpatient Diabetes medications: insulin at home Current orders for Inpatient glycemic control:  Novolog 0-15 units tid  Nepro bid between meals Decadron 6 mg Q24 hours  Inpatient Diabetes Program Recommendations:    -  Consider Lantus 5 units  Spoke with pt over the phone on 8/16. See note for details.  Thanks,  Christena Deem RN, MSN, BC-ADM Inpatient Diabetes Coordinator Team Pager [phone] (8a-5p)

## 2020-08-02 NOTE — Progress Notes (Signed)
ABI has been completed.   Preliminary results in CV Proc.   Blanch Media 08/02/2020 3:29 PM

## 2020-08-02 NOTE — Plan of Care (Signed)
  Problem: Education: Goal: Knowledge of General Education information will improve Description Including pain rating scale, medication(s)/side effects and non-pharmacologic comfort measures Outcome: Progressing   

## 2020-08-02 NOTE — Progress Notes (Addendum)
[facility] Progress Note  Patient: Todd Farrell    KKD:[id number]  DOA: 08/14/2020     Date of Service: the patient was seen and examined on 08/02/2020  Brief hospital course: diabetes mellitus type 2, hyperlipidemia, vitamin D deficiency, benign prostatic hyperplasia, chronic kidney disease stage IIIb. Presents with DKA, sepsis secondary to UTI and pneumonia as well as COVID-19. Currently plan is continue antibiotics.  Assessment and Plan: 1. Sepsis due to combined community-acquired pneumonia as well as UTI and bacteremia with E. coli. Also secondary to COVID-19. POA. Acute sinusitis Met SIRS criteria with lactic acidosis, encephalopathy, tachycardia, hypothermia as well as leukocytosis. Treated aggressively with IV fluids. Currently sepsis physiology has resolved. Lactic acidosis has resolved as well. Blood cultures positive for E. coli. Treated with IV ceftriaxone then changed to IV IV cefazolin then changed to Augmentin.  Continue for a total of 10 days. CT shows evidence of sinusitis as well as patient has pneumonia on the chest x-ray therefore we will continue azithromycin. Monitor response to treatment.  2.  Acute metabolic encephalopathy Dysphagia In the setting of sepsis and DKA. Currently getting better. Initially was NPO.  Speech therapy was consulted.  Done on dysphagia 1 diet.  Now on dysphagia 3 diet. CT scan negative for any acute stroke. Metabolic work-up unremarkable.  3.  DKA Uncontrolled type 2 diabetes mellitus with hyperglycemia with renal complication Presented with hyperglycemia, anion gap acidosis as well as elevated beta hydroxybutyric acid. Treated with IV insulin. Currently on basal bolus regimen. Monitor.  4.  Acute hypoxic respiratory failure. Acute COVID-19 Viral Pneumonia Lab Results  Component Value Date   SARSCOV2NAA POSITIVE (A) 07/19/2020   CXR: hazy bilateral peripheral opacities CT chest: GGO, consolidation Recent Labs     07/31/20 0635 08/01/20 0152 08/02/20 0500  DDIMER 8.24* 3.67* 3.20*  FERRITIN 1,229* 971* 1,045*  CRP 8.6* 4.5* 6.4*   Tmax last 24 hours:  Temp (24hrs), Avg:97.5 F (36.4 C), Min:97.2 F (36.2 C), Max:97.8 F (36.6 C)  Oxygen requirements: 3 LPM.  Antibiotics: Ceftriaxone and azithromycin for UTI and pneumonia Vitamin C and Zinc: Currently n.p.o. DVT Prophylaxis:  Remdesivir: Started on 07/28/2020 Steroids: Decadron started on 07/20/2020 Baricitinib/Actemra(off-label use): Not a candidate due to sepsis and bacteremia  5.  Hyperkalemia Acute kidney injury on chronic kidney disease stage IIIb Renal function actually improving. Continue with IV hydration.  Monitor.  Potassium also improving.  Monitor.  6.  Severe protein calorie malnutrition Failure to thrive Body mass index is 14.88 kg/m.  Dietary consulted. Monitor. At risk for poor outcome.  7.  Bilateral great toe ulceration Suspect in the setting of PVD. Arterial study shows bilateral obstruction. Vascular surgery consult appreciated. Currently on antibiotic. Does not appear acutely infected  8.  Poor IV access. Difficult to obtain labs.  9. Prolonged QTC Hypokalemia Potassium corrected. Continue to monitor.  10 RPR reactive HIV negative Done as a part of encephalopathy work-up. Currently T. pallidum negative.  No further work-up.  11.  Vomiting. X-ray abdomen shows no acute abnormality.  Needs no further work-up.  12.  DVT Right posterior tibial acute DVT. This appears to be distal DVT at present given that the patient has limited mobility currently think the patient will remain at high risk for progression of this DVT and therefore strategy of monitoring progression with serial Dopplers will not be successful. We will recommend heparin for DVT treatment for now. Once renal function stabilizes transition to oral Eliquis. At a minimum 3 to 6 months of  anticoagulation.  Although likely will require longer  anticoagulation  13.  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 Patient denies any acute pain in his legs right now. Suspect this is chronic in nature. Vascular surgery will be consulted. Currently on heparin.  14. social issue. Patient has informed me to contact the daughter over her sister. Sister has reported that patient only resume care when sister called police to check on him. Social worker consulted currently. Patient told me that he does not have any abuse at home.  Diet: Dysphagia 3 diet DVT Prophylaxis: IV heparin  Advance goals of care discussion: Full code  Family Communication: no family was present at bedside, at the time of interview.  The pt provided permission to discuss medical plan with the family.  Discussed with patient's daughter per patient's request. opportunity was given to ask question and all questions were answered satisfactorily.   Disposition:  Status is: Inpatient  Remains inpatient appropriate because:IV treatments appropriate due to intensity of illness or inability to take PO   Dispo: The patient is from: Home              Anticipated d/c is to: SNF              Anticipated d/c date is: 3 days              Patient currently is not medically stable to d/c.  Subjective: No nausea no vomiting.  No fever no chills.  Physical Exam:  General: Appear in mild distress, no Rash; Oral Mucosa Clear, moist. no Abnormal Neck Mass Or lumps, Conjunctiva normal  Cardiovascular: S1 and S2 Present, no Murmur, Respiratory: good respiratory effort, Bilateral Air entry present and bilateral  Crackles, no wheezes Abdomen: Bowel Sound present, Soft and no tenderness Extremities: no Pedal edema, no calf tenderness, bilateral toe ulceration and dry gangrene Neurology: alert and oriented to time, place, and person affect appropriate. no new focal deficit Gait not checked due  to patient safety concerns   Vitals:   08/01/20 2000 08/02/20 0000 08/02/20 0400 08/02/20 1456  BP: (!) 150/112 (!) 163/96 (!) 137/96   Pulse: 75 93 75   Resp: _0 Temp: 97.6 F (36.4 C)  97.8 F (36.6 C) (!) 97.2 F (36.2 C)  TempSrc: Oral  Oral Axillary  SpO2: 100% 100% 99%   Weight:      Height:        Intake/Output Summary (Last 24 hours) at 08/02/2020 1954 Last data filed at 08/02/2020 1824 Gross per 24 hour  Intake 1519.59 ml  Output 3000 ml  Net -1480.41 ml   Filed Weights   07/28/20 0012  Weight: 43.1 kg    Data Reviewed: I have personally reviewed and interpreted daily labs, tele strips, imagings as discussed above. I reviewed all nursing notes, pharmacy notes, vitals, pertinent old records I have discussed plan of care as described above with RN and patient/family.  CBC: Recent Labs  Lab 08/06/2020 2329 08/13/2020 2329 07/28/20 0205 07/28/20 1701 07/31/20 0635 08/01/20 0152 08/02/20 0500  WBC 15.9*  --   --  17.8* 15.6* 10.9* 11.6*  NEUTROABS 14.6*  --   --  16.8* 13.9* 9.3* 9.5*  HGB 18.2*   < > 16.3 14.8 16.3 13.3 13.7  HCT 59.2*   < > 48.0 45.8 49.7 40.6 41.3  MCV 91.4  --   --  86.1 87.8 86.9 87.5  PLT 360  --   --  269 271 224 217   < > = values in this interval not displayed.   Basic Metabolic Panel: Recent Labs  Lab 07/28/20 2212 07/29/20 0226 07/31/20 0635 08/01/20 0152 08/02/20 0500  NA 145 145 148* 143 142  K 3.8 4.1 3.4* 3.4* 3.9  CL 116* 105 109 108 108  CO2 20* _0 GLUCOSE 300* 296* 96 261* 265*  BUN 71* 90* 56* 45* 39*  CREATININE 1.93* 2.56* 1.76* 1.57* 1.58*  CALCIUM 5.5* 8.0* 8.0* 7.0* 7.4*  MG  --   --  1.5* 1.7 1.4*    Studies: VAS [place] ABI WITH/WO TBI  Result Date: 08/02/2020 LOWER EXTREMITY DOPPLER STUDY Indications: Ulceration, and bilateral toe ulcers.  Limitations: Today's exam was limited due to bandages. Performing Technologist: Abram Sander RVS  Examination Guidelines: A complete evaluation includes  at minimum, Doppler waveform signals and systolic blood pressure reading at the level of bilateral brachial, anterior tibial, and posterior tibial arteries, when vessel segments are accessible. Bilateral testing is considered an integral part of a complete examination. Photoelectric Plethysmograph (PPG) waveforms and toe systolic pressure readings are included as required and additional duplex testing as needed. Limited examinations for reoccurring indications may be performed as noted.  ABI Findings: +--------+------------------+-----+---------+--------+ Right   Rt Pressure (mmHg)IndexWaveform Comment  +--------+------------------+-----+---------+--------+ [id number]                    triphasic         +--------+------------------+-----+---------+--------+ PTA                            absent            +--------+------------------+-----+---------+--------+ DP                             absent            +--------+------------------+-----+---------+--------+ +--------+------------------+-----+---------+-------+ Left    Lt Pressure (mmHg)IndexWaveform Comment +--------+------------------+-----+---------+-------+ [id number]                    triphasic        +--------+------------------+-----+---------+-------+ PTA                            absent           +--------+------------------+-----+---------+-------+ DP                             absent           +--------+------------------+-----+---------+-------+ +-------+-----------+-----------+------------+------------+ ABI/TBIToday's ABIToday's TBIPrevious ABIPrevious TBI +-------+-----------+-----------+------------+------------+ Right  0.56                                           +-------+-----------+-----------+------------+------------+  Summary: Right: Resting right ankle-brachial index indicates moderate right lower extremity arterial disease. Left: Resting left ankle-brachial index indicates  critical left limb ischemia.  *See table(s) above for measurements and observations.  Electronically signed by Deitra Mayo MD on 08/02/2020 at 5:09:30 PM.   Final     Scheduled Meds: . albuterol  2 puff Inhalation Q6H  . amoxicillin-clavulanate  1 tablet Oral BID  . dexamethasone  6 mg  Oral Daily  . feeding supplement (NEPRO CARB STEADY)  237 mL Oral BID BM  . insulin aspart  0-15 Units Subcutaneous TID WC  . multivitamin with minerals  1 tablet Oral Daily   Continuous Infusions: . heparin 850 Units/hr (08/02/20 1108)  . lactated ringers 125 mL/hr at 08/02/20 1824   PRN Meds: acetaminophen, polyethylene glycol  Time spent: 35 minutes  Author: Berle Mull, MD [facility] 08/02/2020 7:54 PM  To reach On-call, see care teams to locate the attending and reach out via [email or web address]. Between 7PM-7AM, please contact night-coverage If you still have difficulty reaching the attending provider, please page the [facility] (Director on Call) for [facility] on amion for assistance.

## 2020-08-02 NOTE — Progress Notes (Signed)
CSW notes SNF recommendation. Todd Farrell is lack of insurance or payer source. Financial Counseling contacted to see if Medicaid application can be completed with family.   Safire Gordin LCSW

## 2020-08-02 NOTE — Evaluation (Addendum)
Physical Therapy Evaluation Patient Details Name: Todd Farrell MRN: [id number] DOB: 1956/08/31 Today's Date: 08/02/2020   History of Present Illness  Pt adm 8/12 with sepsis due to combined Covid PNA as well as UTI. Pt also with acute metabolic encephalopathy and DKA. Pt found to have RLE DVT on 8/15 and heparin initiated. PMH -  DM, ckd, BPH  Clinical Impression  Pt admitted with above diagnosis and presents to PT with functional limitations due to deficits listed below (See PT problem list). Pt needs skilled PT to maximize independence and safety to allow discharge to ST-SNF. Pt with decr home support and currently requiring assist for all mobility. Pt incontinent of stool in bed and then again when we stood. Pt unaware.      Follow Up Recommendations SNF    Equipment Recommendations  None recommended by PT    Recommendations for Other Services       Precautions / Restrictions Precautions Precautions: Fall      Mobility  Bed Mobility Overal bed mobility: Needs Assistance Bed Mobility: Supine to Sit     Supine to sit: Supervision     General bed mobility comments: Incr time and effort  Transfers Overall transfer level: Needs assistance Equipment used: 4-wheeled walker;1 person hand held assist Transfers: Sit to/from Stand Sit to Stand: Min assist         General transfer comment: Assist to bring hips up and for balance  Ambulation/Gait Ambulation/Gait assistance: Min assist Gait Distance (Feet): 20 Feet (20' x 1, 12' x 1) Assistive device: 4-wheeled walker;1 person hand held assist Gait Pattern/deviations: Step-through pattern;Decreased step length - right;Decreased step length - left;Shuffle;Narrow base of support;Trunk flexed Gait velocity: decr Gait velocity interpretation: <1.8 ft/sec, indicate of risk for recurrent falls General Gait Details: Assist for balance and support. Pt fatigues quickly and then begins to rush.   Stairs            Wheelchair  Mobility    Modified Rankin (Stroke Patients Only)       Balance Overall balance assessment: Needs assistance Sitting-balance support: No upper extremity supported;Feet supported Sitting balance-Leahy Scale: Fair     Standing balance support: Bilateral upper extremity supported;During functional activity;Single extremity supported Standing balance-Leahy Scale: Poor Standing balance comment: UE support and min guard for static standing                             Pertinent Vitals/Pain Pain Assessment: No/denies pain    Home Living Family/patient expects to be discharged to:: Private residence Living Arrangements: Alone Available Help at Discharge: Family;Available 24 hours/day (States his daughters can come stay with him) Type of Home: Apartment Home Access: Level entry     Home Layout: One level Home Equipment: Environmental consultant - 2 wheels      Prior Function Level of Independence: Independent               Hand Dominance   Dominant Hand:  (Ambidextrous)    Extremity/Trunk Assessment   Upper Extremity Assessment Upper Extremity Assessment: Defer to OT evaluation    Lower Extremity Assessment Lower Extremity Assessment: Generalized weakness    Cervical / Trunk Assessment Cervical / Trunk Assessment: Kyphotic  Communication   Communication: No difficulties  Cognition Arousal/Alertness: Awake/alert Behavior During Therapy: Anxious Overall Cognitive Status: No family/caregiver present to determine baseline cognitive functioning Area of Impairment: Following commands;Safety/judgement;Awareness;Problem solving  Following Commands: Follows one step commands with increased time;Follows multi-step commands inconsistently Safety/Judgement: Decreased awareness of safety Awareness: Emergent Problem Solving: Slow processing;Requires verbal cues General Comments: Incr anxiety with mobility and begins to rush as he fatigues and  becomes short of breath      General Comments General comments (skin integrity, edema, etc.): Pt on 2L O2 with SpO2 >90%. HR to 120's with activity.    Exercises Other Exercises Other Exercises: Purse lip breathing techniques   Assessment/Plan    PT Assessment Patient needs continued PT services  PT Problem List Decreased strength;Decreased activity tolerance;Decreased balance;Decreased mobility;Decreased knowledge of use of DME;Cardiopulmonary status limiting activity       PT Treatment Interventions DME instruction;Gait training;Functional mobility training;Therapeutic activities;Therapeutic exercise;Balance training;Patient/family education    PT Goals (Current goals can be found in the Care Plan section)  Acute Rehab PT Goals Patient Stated Goal: Get better PT Goal Formulation: With patient Time For Goal Achievement: 08/16/20 Potential to Achieve Goals: Fair    Frequency Min 3X/week   Barriers to discharge Decreased caregiver support wife recently died    Co-evaluation               AM-PAC PT "6 Clicks" Mobility  Outcome Measure Help needed turning from your back to your side while in a flat bed without using bedrails?: None Help needed moving from lying on your back to sitting on the side of a flat bed without using bedrails?: A Little Help needed moving to and from a bed to a chair (including a wheelchair)?: A Little Help needed standing up from a chair using your arms (e.g., wheelchair or bedside chair)?: A Little Help needed to walk in hospital room?: A Little Help needed climbing 3-5 steps with a railing? : A Lot 6 Click Score: 18    End of Session Equipment Utilized During Treatment: Oxygen Activity Tolerance: Patient limited by fatigue Patient left: in chair;with call bell/phone within reach Nurse Communication: Mobility status (nurse tech) PT Visit Diagnosis: Unsteadiness on feet (R26.81);Other abnormalities of gait and mobility (R26.89);Muscle  weakness (generalized) (M62.81)    Time: 1157-2620 PT Time Calculation (min) (ACUTE ONLY): 39 min   Charges:   PT Evaluation $PT Eval Moderate Complexity: 1 Mod PT Treatments $Gait Training: 23-37 mins        [facility] PT Acute Rehabilitation Services Pager [phone] Office [phone]   [name] [facility] 08/02/2020, 4:47 PM

## 2020-08-03 DIAGNOSIS — U071 COVID-19: Secondary | ICD-10-CM | POA: Diagnosis not present

## 2020-08-03 DIAGNOSIS — A4189 Other specified sepsis: Secondary | ICD-10-CM | POA: Diagnosis not present

## 2020-08-03 LAB — CBC WITH DIFFERENTIAL/PLATELET
Abs Immature Granulocytes: 0.42 10*3/uL — ABNORMAL HIGH (ref 0.00–0.07)
Basophils Absolute: 0 10*3/uL (ref 0.0–0.1)
Basophils Relative: 0 %
Eosinophils Absolute: 0 10*3/uL (ref 0.0–0.5)
Eosinophils Relative: 0 %
HCT: 41.6 % (ref 39.0–52.0)
Hemoglobin: 13.7 g/dL (ref 13.0–17.0)
Immature Granulocytes: 4 %
Lymphocytes Relative: 7 %
Lymphs Abs: 0.8 10*3/uL (ref 0.7–4.0)
MCH: 28.7 pg (ref 26.0–34.0)
MCHC: 32.9 g/dL (ref 30.0–36.0)
MCV: 87 fL (ref 80.0–100.0)
Monocytes Absolute: 0.6 10*3/uL (ref 0.1–1.0)
Monocytes Relative: 5 %
Neutro Abs: 10.1 10*3/uL — ABNORMAL HIGH (ref 1.7–7.7)
Neutrophils Relative %: 84 %
Platelets: 212 10*3/uL (ref 150–400)
RBC: 4.78 MIL/uL (ref 4.22–5.81)
RDW: 13.4 % (ref 11.5–15.5)
WBC: 11.9 10*3/uL — ABNORMAL HIGH (ref 4.0–10.5)
nRBC: 0.2 % (ref 0.0–0.2)

## 2020-08-03 LAB — COMPREHENSIVE METABOLIC PANEL
ALT: 17 U/L (ref 0–44)
AST: 33 U/L (ref 15–41)
Albumin: 1.5 g/dL — ABNORMAL LOW (ref 3.5–5.0)
Alkaline Phosphatase: 82 U/L (ref 38–126)
Anion gap: 13 (ref 5–15)
BUN: 32 mg/dL — ABNORMAL HIGH (ref 8–23)
CO2: 22 mmol/L (ref 22–32)
Calcium: 7.5 mg/dL — ABNORMAL LOW (ref 8.9–10.3)
Chloride: 106 mmol/L (ref 98–111)
Creatinine, Ser: 1.51 mg/dL — ABNORMAL HIGH (ref 0.61–1.24)
GFR calc Af Amer: 56 mL/min — ABNORMAL LOW (ref >60)
GFR calc non Af Amer: 48 mL/min — ABNORMAL LOW (ref >60)
Glucose, Bld: 163 mg/dL — ABNORMAL HIGH (ref 70–99)
Potassium: 4.2 mmol/L (ref 3.5–5.1)
Sodium: 141 mmol/L (ref 135–145)
Total Bilirubin: 1.1 mg/dL (ref 0.3–1.2)
Total Protein: 5.3 g/dL — ABNORMAL LOW (ref 6.5–8.1)

## 2020-08-03 LAB — GLUCOSE, CAPILLARY
Glucose-Capillary: 155 mg/dL — ABNORMAL HIGH (ref 70–99)
Glucose-Capillary: 112 mg/dL — ABNORMAL HIGH (ref 70–99)
Glucose-Capillary: 213 mg/dL — ABNORMAL HIGH (ref 70–99)
Glucose-Capillary: 253 mg/dL — ABNORMAL HIGH (ref 70–99)
Glucose-Capillary: 193 mg/dL — ABNORMAL HIGH (ref 70–99)
Glucose-Capillary: 107 mg/dL — ABNORMAL HIGH (ref 70–99)

## 2020-08-03 LAB — FERRITIN: Ferritin: 1088 ng/mL — ABNORMAL HIGH (ref 24–336)

## 2020-08-03 LAB — HEPARIN LEVEL (UNFRACTIONATED)
Heparin Unfractionated: <0.1 IU/mL — ABNORMAL LOW (ref 0.30–0.70)
Heparin Unfractionated: 0.2 IU/mL — ABNORMAL LOW (ref 0.30–0.70)

## 2020-08-03 LAB — D-DIMER, QUANTITATIVE: D-Dimer, Quant: 2.71 ug/mL-FEU — ABNORMAL HIGH (ref 0.00–0.50)

## 2020-08-03 LAB — C-REACTIVE PROTEIN: CRP: 8.7 mg/dL — ABNORMAL HIGH (ref <1.0)

## 2020-08-03 LAB — MAGNESIUM: Magnesium: 1.4 mg/dL — ABNORMAL LOW (ref 1.7–2.4)

## 2020-08-03 MED ORDER — INSULIN GLARGINE 100 UNIT/ML ~~LOC~~ SOLN
5.0000 [IU] | Freq: Every day | SUBCUTANEOUS | Status: DC
  Administered 2020-08-05 – 2020-08-10 (×6): 5 [IU] via SUBCUTANEOUS
  Filled 2020-08-03 (×13): qty 0.05

## 2020-08-03 MED ORDER — INSULIN ASPART 100 UNIT/ML ~~LOC~~ SOLN
5.0000 [IU] | Freq: Once | SUBCUTANEOUS | Status: AC
  Administered 2020-08-03: 5 [IU] via SUBCUTANEOUS

## 2020-08-03 MED ORDER — HEPARIN BOLUS VIA INFUSION
650.0000 [IU] | Freq: Once | INTRAVENOUS | Status: AC
  Administered 2020-08-03: 650 [IU] via INTRAVENOUS
  Filled 2020-08-03: qty 650

## 2020-08-03 MED ORDER — HEPARIN BOLUS VIA INFUSION
1500.0000 [IU] | Freq: Once | INTRAVENOUS | Status: AC
  Administered 2020-08-03: 1500 [IU] via INTRAVENOUS
  Filled 2020-08-03: qty 1500

## 2020-08-03 MED ORDER — SALINE SPRAY 0.65 % NA SOLN
1.0000 | NASAL | Status: DC | PRN
  Filled 2020-08-03: qty 44

## 2020-08-03 MED ORDER — MAGNESIUM SULFATE 2 GM/50ML IV SOLN
2.0000 g | Freq: Once | INTRAVENOUS | Status: AC
  Administered 2020-08-03: 2 g via INTRAVENOUS
  Filled 2020-08-03: qty 50

## 2020-08-03 NOTE — Progress Notes (Signed)
PROGRESS NOTE    Todd Farrell  ZDG:[id number] DOB: Aug 02, 1956 DOA: 08/09/2020 PCP: Patient, No Pcp Per    Brief Narrative:  64 year old male with history of type 2 diabetes on insulin, hyperlipidemia, vitamin D deficiency, BPH, chronic kidney disease stage IIIb who presented to the emergency room with confusion and lethargic.  Patient was hypothermic and hypoxic.  In the emergency room, he was tachycardic and leukocytosis with severe lactic acidosis and evidence of DKA.  Treated with IV fluids and insulin drip, broad-spectrum antibiotics. COVID-19 positive.  Blood cultures with E. coli.  Urine culture with insignificant growth.  Clinically improving.   Assessment & Plan:   Principal Problem:   Sepsis due to COVID-19 [facility]) Active Problems:   Diabetic ketoacidosis without coma associated with type 2 diabetes mellitus (HCC)   Lactic acidosis   Acute respiratory failure with hypoxia (HCC)   Acute renal failure superimposed on stage 3b chronic kidney disease (HCC)   Acute metabolic encephalopathy   Hyperkalemia, diminished renal excretion   Severe protein-calorie malnutrition (HCC)   Prolonged QT interval  Sepsis present on admission due to multiple sources of infection: Sepsis physiology improving.  Continue to treat infection.  E. coli bacteremia due to UTI: Treated with IV fluids.  Treated with ceftriaxone then changed to IV cefazolin now changed to Augmentin.  Will treat with total 10 days of therapy.  COVID-19 viral infection with hypoxia: Pulmonary symptoms improved.  He is on dexamethasone, will discontinue and 10 days.  Peripheral vascular disease with arterial ulcers: Significant ulcerations of the toes, ABI with bilateral obstruction.  Vascular surgery consulted.  Undergoing angiogram with runoff tomorrow.  Acute DVT , right posterior tibial acute DVT: Present on admission.  Currently on heparin.  Will change to Eliquis after procedures.    COVID-19 Labs  Recent Labs     08/01/20 0152 08/02/20 0500 08/03/20 0437  DDIMER 3.67* 3.20* 2.71*  FERRITIN 971* 1,045* 1,088*  CRP 4.5* 6.4* 8.7*    Lab Results  Component Value Date   SARSCOV2NAA POSITIVE (A) 08/02/2020    SpO2: 100 % O2 Flow Rate (L/min): 2 L/min   Diabetic ketoacidosis: Uncontrolled type 2 diabetes.  Hemoglobin A1c 14.  Unsure about taking any medicine before admission.  Currently started on insulin regimen.  We will continue to monitor and adjust doses. Patient will need long-acting insulin, will start with Lantus 5 units at night.  Acute metabolic encephalopathy: Improved.  Nutrition Status: Nutrition Problem: Increased nutrient needs Etiology: catabolic illness (pneumonia secondary COVID-19 virus infection) Signs/Symptoms: estimated needs Interventions: MVI, Hormel Shake, Nepro shake  Acute kidney injury on CKD stage IIIb: Improved with IV fluids.  Presented with creatinine of more than 4, improved with IV fluid resuscitation.  No evidence of obstruction.  Profound physical debility: Work with PT OT.  Recommended rehab.  Acute DVT right posterior tibial veins: On heparin drip.  Continue until surgical procedures.   DVT prophylaxis: Heparin infusion   Code Status: Full code Family Communication: Daughter on the phone Disposition Plan: Status is: Inpatient  Remains inpatient appropriate because:IV treatments appropriate due to intensity of illness or inability to take PO   Dispo: The patient is from: Home              Anticipated d/c is to: SNF              Anticipated d/c date is: 3 days              Patient currently is not medically  stable to d/c.         Consultants:   Vascular surgery  Procedures:   None  Antimicrobials:  Antibiotics Given (last 72 hours)    Date/Time Action Medication Dose Rate   07/31/20 2125 New Bag/Given   doxycycline (VIBRAMYCIN) 100 mg in sodium chloride 0.9 % 250 mL IVPB 100 mg 125 mL/hr   08/01/20 0833 Given    amoxicillin-clavulanate (AUGMENTIN) 875-125 MG per tablet 1 tablet 1 tablet    08/01/20 0834 New Bag/Given   remdesivir 100 mg in sodium chloride 0.9 % 100 mL IVPB 100 mg 200 mL/hr   08/01/20 2157 Given   amoxicillin-clavulanate (AUGMENTIN) 875-125 MG per tablet 1 tablet 1 tablet    08/02/20 0954 Given   amoxicillin-clavulanate (AUGMENTIN) 875-125 MG per tablet 1 tablet 1 tablet    08/02/20 1925 Given   amoxicillin-clavulanate (AUGMENTIN) 500-125 MG per tablet 500 mg 500 mg    08/03/20 0955 Given   amoxicillin-clavulanate (AUGMENTIN) 500-125 MG per tablet 500 mg 500 mg          Subjective: Patient was seen and examined.  He wants to go home.  He wants to go home to celebrate his birthday.  Objective: Vitals:   08/02/20 2019 08/03/20 0430 08/03/20 0800 08/03/20 1410  BP: 139/79 (!) 154/89 (!) 163/93 133/81  Pulse: 78 85 79   Resp: 18 18 18 20   Temp: 98.2 F (36.8 C) 98.6 F (37 C) 98.1 F (36.7 C) (!) 97.4 F (36.3 C)  TempSrc: Oral Oral Oral Oral  SpO2: 99% 100% 100%   Weight:      Height:        Intake/Output Summary (Last 24 hours) at 08/03/2020 1656 Last data filed at 08/03/2020 1408 Gross per 24 hour  Intake 498.98 ml  Output 3900 ml  Net -3401.02 ml   Filed Weights   07/28/20 0012  Weight: 43.1 kg    Examination:  General exam: Appears calm and comfortable  Very frail looking gentleman, not in any distress. Respiratory system: Clear to auscultation. Respiratory effort normal.  Some conducted airway sounds. Cardiovascular system: S1 & S2 heard, RRR.  Gastrointestinal system: Abdomen is nondistended, soft and nontender. No organomegaly or masses felt. Normal bowel sounds heard. Central nervous system: Alert and oriented. No focal neurological deficits.  Generalized weakness. Extremities: Symmetric 5 x 5 power. Skin: No pedal pulse present.  Bilateral toe ulcerations and dry gangrene.    Data Reviewed: I have personally reviewed following labs and imaging  studies  CBC: Recent Labs  Lab 07/28/20 1701 07/31/20 0635 08/01/20 0152 08/02/20 0500 08/03/20 0437  WBC 17.8* 15.6* 10.9* 11.6* 11.9*  NEUTROABS 16.8* 13.9* 9.3* 9.5* 10.1*  HGB 14.8 16.3 13.3 13.7 13.7  HCT 45.8 49.7 40.6 41.3 41.6  MCV 86.1 87.8 86.9 87.5 87.0  PLT 269 271 224 217 212   Basic Metabolic Panel: Recent Labs  Lab 07/29/20 0226 07/31/20 0635 08/01/20 0152 08/02/20 0500 08/03/20 0437  NA 145 148* 143 142 141  K 4.1 3.4* 3.4* 3.9 4.2  CL 105 109 108 108 106  CO2 27 24 23 22 22   GLUCOSE 296* 96 261* 265* 163*  BUN 90* 56* 45* 39* 32*  CREATININE 2.56* 1.76* 1.57* 1.58* 1.51*  CALCIUM 8.0* 8.0* 7.0* 7.4* 7.5*  MG  --  1.5* 1.7 1.4* 1.4*   GFR: Estimated Creatinine Clearance: 30.5 mL/min (A) (by C-G formula based on SCr of 1.51 mg/dL (H)). Liver Function Tests: Recent Labs  Lab 08/03/2020 2329  07/31/20 0635 08/01/20 0152 08/02/20 0500 08/03/20 0437  AST 47* 35 23 27 33  ALT ALKPHOS 83 73 68 81 82  BILITOT 3.2* 0.7 0.8 0.7 1.1  PROT 8.3* 6.0* 4.8* 5.2* 5.3*  ALBUMIN 2.4* 1.8* 1.4* 1.6* 1.5*   No results for input(s): LIPASE, AMYLASE in the last 168 hours. Recent Labs  Lab 07/28/20 1700  AMMONIA 25   Coagulation Profile: Recent Labs  Lab 07/28/20 0117  INR 1.1   Cardiac Enzymes: No results for input(s): CKTOTAL, CKMB, CKMBINDEX, TROPONINI in the last 168 hours. BNP (last 3 results) No results for input(s): PROBNP in the last 8760 hours. HbA1C: No results for input(s): HGBA1C in the last 72 hours. CBG: Recent Labs  Lab 08/02/20 1812 08/02/20 2107 08/03/20 0732 08/03/20 1201 08/03/20 1606  GLUCAP 131* 142* 155* 112* 213*   Lipid Profile: No results for input(s): CHOL, HDL, LDLCALC, TRIG, CHOLHDL, LDLDIRECT in the last 72 hours. Thyroid Function Tests: No results for input(s): TSH, T4TOTAL, FREET4, T3FREE, THYROIDAB in the last 72 hours. Anemia Panel: Recent Labs    08/02/20 0500 08/03/20 0437  FERRITIN  1,045* 1,088*   Sepsis Labs: Recent Labs  Lab 07/28/20 0229 07/28/20 0425 07/28/20 0947 07/28/20 1700 07/28/20 2212  PROCALCITON  --   --  3.38  --   --   LATICACIDVEN 3.8* 5.2*  --  3.2* 2.0*    Recent Results (from the past 240 hour(s))  SARS Coronavirus 2 by RT PCR (hospital order, performed in [facility] hospital lab) Nasopharyngeal Nasopharyngeal Swab     Status: Abnormal   Collection Time: 08/15/2020 11:17 PM   Specimen: Nasopharyngeal Swab  Result Value Ref Range Status   SARS Coronavirus 2 POSITIVE (A) NEGATIVE Final    Comment: RESULT CALLED TO, READ BACK BY AND VERIFIED WITH: Laurelyn Sickle RN 07/28/20 0221 JDW (NOTE) SARS-CoV-2 target nucleic acids are DETECTED  SARS-CoV-2 RNA is generally detectable in upper respiratory specimens  during the acute phase of infection.  Positive results are indicative  of the presence of the identified virus, but do not rule out bacterial infection or co-infection with other pathogens not detected by the test.  Clinical correlation with patient history and  other diagnostic information is necessary to determine patient infection status.  The expected result is negative.  Fact Sheet for Patients:   BoilerBrush.com.cy   Fact Sheet for Healthcare Providers:   https://pope.com/    This test is not yet approved or cleared by the [place] FDA and  has been authorized for detection and/or diagnosis of SARS-CoV-2 by FDA under an Emergency Use Authorization (EUA).  This EUA will remain in effect (meaning this test c an be used) for the duration of  the COVID-19 declaration under Section 564(b)(1) of the Act, 21 U.S.C. section 360-bbb-3(b)(1), unless the authorization is terminated or revoked sooner.  Performed at [facility], [address], [place], [place] [address]   Culture, blood (x 2)     Status: Abnormal   Collection Time: 07/23/2020 11:29 PM   Specimen: BLOOD LEFT WRIST    Result Value Ref Range Status   Specimen Description BLOOD LEFT WRIST  Final   Special Requests   Final    BOTTLES DRAWN AEROBIC ONLY Blood Culture results may not be optimal due to an inadequate volume of blood received in culture bottles   Culture  Setup Time   Final    GRAM NEGATIVE RODS AEROBIC BOTTLE ONLY  Organism ID to follow CRITICAL RESULT CALLED TO, READ BACK BY AND VERIFIED WITHKelby Fam: F WILSON PHARMD 1901 07/28/20 A BROWNING Performed at [facility], [address] [place], [place] [address]    Culture ESCHERICHIA COLI (A)  Final   Report Status 07/30/2020 FINAL  Final   Organism ID, Bacteria ESCHERICHIA COLI  Final      Susceptibility   Escherichia coli - MIC*    AMPICILLIN <=2 SENSITIVE Sensitive     CEFAZOLIN <=4 SENSITIVE Sensitive     CEFEPIME <=0.12 SENSITIVE Sensitive     CEFTAZIDIME <=1 SENSITIVE Sensitive     CEFTRIAXONE <=0.25 SENSITIVE Sensitive     CIPROFLOXACIN <=0.25 SENSITIVE Sensitive     GENTAMICIN <=1 SENSITIVE Sensitive     IMIPENEM <=0.25 SENSITIVE Sensitive     TRIMETH/SULFA <=20 SENSITIVE Sensitive     AMPICILLIN/SULBACTAM <=2 SENSITIVE Sensitive     PIP/TAZO <=4 SENSITIVE Sensitive     * ESCHERICHIA COLI  Blood Culture ID Panel (Reflexed)     Status: Abnormal   Collection Time: 07/19/2020 11:29 PM  Result Value Ref Range Status   Enterococcus faecalis NOT DETECTED NOT DETECTED Final   Enterococcus Faecium NOT DETECTED NOT DETECTED Final   Listeria monocytogenes NOT DETECTED NOT DETECTED Final   Staphylococcus species NOT DETECTED NOT DETECTED Final   Staphylococcus aureus (BCID) NOT DETECTED NOT DETECTED Final   Staphylococcus epidermidis NOT DETECTED NOT DETECTED Final   Staphylococcus lugdunensis NOT DETECTED NOT DETECTED Final   Streptococcus species NOT DETECTED NOT DETECTED Final   Streptococcus agalactiae NOT DETECTED NOT DETECTED Final   Streptococcus pneumoniae NOT DETECTED NOT DETECTED Final   Streptococcus pyogenes NOT DETECTED NOT  DETECTED Final   A.calcoaceticus-baumannii NOT DETECTED NOT DETECTED Final   Bacteroides fragilis NOT DETECTED NOT DETECTED Final   Enterobacterales DETECTED (A) NOT DETECTED Final    Comment: Enterobacterales represent a large order of gram negative bacteria, not a single organism.   Enterobacter cloacae complex NOT DETECTED NOT DETECTED Final   Escherichia coli DETECTED (A) NOT DETECTED Final    Comment: CRITICAL RESULT CALLED TO, READ BACK BY AND VERIFIED WITH: Kelby FamF WILSON PHARMD 1901 07/28/20 A BROWNING    Klebsiella aerogenes NOT DETECTED NOT DETECTED Final   Klebsiella oxytoca NOT DETECTED NOT DETECTED Final   Klebsiella pneumoniae NOT DETECTED NOT DETECTED Final   Proteus species NOT DETECTED NOT DETECTED Final   Salmonella species NOT DETECTED NOT DETECTED Final   Serratia marcescens NOT DETECTED NOT DETECTED Final   Haemophilus influenzae NOT DETECTED NOT DETECTED Final   Neisseria meningitidis NOT DETECTED NOT DETECTED Final   Pseudomonas aeruginosa NOT DETECTED NOT DETECTED Final   Stenotrophomonas maltophilia NOT DETECTED NOT DETECTED Final   Candida albicans NOT DETECTED NOT DETECTED Final   Candida auris NOT DETECTED NOT DETECTED Final   Candida glabrata NOT DETECTED NOT DETECTED Final   Candida krusei NOT DETECTED NOT DETECTED Final   Candida parapsilosis NOT DETECTED NOT DETECTED Final   Candida tropicalis NOT DETECTED NOT DETECTED Final   Cryptococcus neoformans/gattii NOT DETECTED NOT DETECTED Final   CTX-M ESBL NOT DETECTED NOT DETECTED Final   Carbapenem resistance IMP NOT DETECTED NOT DETECTED Final   Carbapenem resistance KPC NOT DETECTED NOT DETECTED Final   Carbapenem resistance NDM NOT DETECTED NOT DETECTED Final   Carbapenem resist OXA 48 LIKE NOT DETECTED NOT DETECTED Final   Carbapenem resistance VIM NOT DETECTED NOT DETECTED Final    Comment: Performed at [facility], [address] [place], [place] [address]  Urine culture     Status: Abnormal    Collection Time: 07/28/20  8:04 AM   Specimen: Urine, Catheterized  Result Value Ref Range Status   Specimen Description URINE, CATHETERIZED  Final   Special Requests NONE  Final   Culture (A)  Final    <10,000 COLONIES/mL INSIGNIFICANT GROWTH Performed at [facility], [address] [place], [place] [address]    Report Status 07/29/2020 FINAL  Final  MRSA PCR Screening     Status: None   Collection Time: 07/30/20  6:01 AM   Specimen: Nasal Mucosa; Nasopharyngeal  Result Value Ref Range Status   MRSA by PCR NEGATIVE NEGATIVE Final    Comment:        The GeneXpert MRSA Assay (FDA approved for NASAL specimens only), is one component of a comprehensive MRSA colonization surveillance program. It is not intended to diagnose MRSA infection nor to guide or monitor treatment for MRSA infections. Performed at [facility], [address] [place], [place] [address]          Radiology Studies: VAS [place] ABI WITH/WO TBI  Result Date: 08/02/2020 LOWER EXTREMITY DOPPLER STUDY Indications: Ulceration, and bilateral toe ulcers.  Limitations: Today's exam was limited due to bandages. Performing Technologist: Blanch Media RVS  Examination Guidelines: A complete evaluation includes at minimum, Doppler waveform signals and systolic blood pressure reading at the level of bilateral brachial, anterior tibial, and posterior tibial arteries, when vessel segments are accessible. Bilateral testing is considered an integral part of a complete examination. Photoelectric Plethysmograph (PPG) waveforms and toe systolic pressure readings are included as required and additional duplex testing as needed. Limited examinations for reoccurring indications may be performed as noted.  ABI Findings: +--------+------------------+-----+---------+--------+ Right   Rt Pressure (mmHg)IndexWaveform Comment  +--------+------------------+-----+---------+--------+ [id number]                    triphasic          +--------+------------------+-----+---------+--------+ PTA                            absent            +--------+------------------+-----+---------+--------+ DP                             absent            +--------+------------------+-----+---------+--------+ +--------+------------------+-----+---------+-------+ Left    Lt Pressure (mmHg)IndexWaveform Comment +--------+------------------+-----+---------+-------+ [id number]                    triphasic        +--------+------------------+-----+---------+-------+ PTA                            absent           +--------+------------------+-----+---------+-------+ DP                             absent           +--------+------------------+-----+---------+-------+ +-------+-----------+-----------+------------+------------+ ABI/TBIToday's ABIToday's TBIPrevious ABIPrevious TBI +-------+-----------+-----------+------------+------------+ Right  0.56                                           +-------+-----------+-----------+------------+------------+  Summary: Right: Resting right ankle-brachial index indicates moderate right lower extremity arterial disease. Left: Resting left ankle-brachial index indicates critical left limb ischemia.  *See table(s) above for measurements and observations.  Electronically signed by Waverly Ferrari MD on 08/02/2020 at 5:09:30 PM.   Final         Scheduled Meds: . albuterol  2 puff Inhalation Q6H  . amoxicillin-clavulanate  1 tablet Oral BID  . dexamethasone  6 mg Oral Daily  . feeding supplement (NEPRO CARB STEADY)  237 mL Oral BID BM  . heparin  650 Units Intravenous Once  . insulin aspart  0-15 Units Subcutaneous TID WC  . insulin glargine  5 Units Subcutaneous QHS  . multivitamin with minerals  1 tablet Oral Daily   Continuous Infusions: . heparin 1,100 Units/hr (08/03/20 0605)  . lactated ringers 125 mL/hr at 08/03/20 1318  . magnesium sulfate bolus IVPB        LOS: 6 days    Time spent: 30 minutes    Dorcas Carrow, MD [facility] Pager [phone]

## 2020-08-03 NOTE — Progress Notes (Signed)
Occupational Therapy Treatment Patient Details Name: Todd Farrell MRN: [id number] DOB: 1956-11-30 Today's Date: 08/03/2020    History of present illness Pt adm 8/12 with sepsis due to combined Covid PNA as well as UTI. Pt also with acute metabolic encephalopathy and DKA. Pt found to have RLE DVT on 8/15 and heparin initiated. PMH -  DM, ckd, BPH   OT comments  Required encouragement to mobilize OOB to chair. Mod A with mobility due to balance deficits and fatigue. Unaware of being incontinent during transfer requiring Max A to clean self. On 2L with 2 extensions during mobility. Difficult to achieve good pleth. Pt most likely desats and demosntrates 2/4 DOE during minimal activity with quick recovery. Ear probe replaced end of session with good pleth. SpO2 on 2 L (2extensions) 94 at rest. Encourage pt to mobilize OOB daily. Will continue to follow acutely. Pt will require rehab at SNF as he is unsafe to DC home due to deficits listed below and significant decline in functional status.   Follow Up Recommendations  SNF;Supervision/Assistance - 24 hour    Equipment Recommendations  Other (comment) (TBA)    Recommendations for Other Services      Precautions / Restrictions Precautions Precautions: Fall       Mobility Bed Mobility Overal bed mobility: Needs Assistance Bed Mobility: Supine to Sit     Supine to sit: Supervision     General bed mobility comments: encouragement to complete on his own. Pt reaching for therpaist to assist  Transfers Overall transfer level: Needs assistance   Transfers: Sit to/from Stand;Stand Pivot Transfers Sit to Stand: Min assist Stand pivot transfers: Mod assist       General transfer comment: uncontrolled descent; LOB during pivot    Balance Overall balance assessment: Needs assistance   Sitting balance-Leahy Scale: Fair       Standing balance-Leahy Scale: Poor                             ADL either performed or assessed  with clinical judgement   ADL Overall ADL's : Needs assistance/impaired Eating/Feeding: Set up   Grooming: Set up;Sitting   Upper Body Bathing: Set up;Supervision/ safety;Sitting   Lower Body Bathing: Moderate assistance;Sit to/from stand               Toileting- Architect and Hygiene: Total assistance Toileting - Clothing Manipulation Details (indicate cue type and reason): incontinent of BM during session - unaware. OT helped clean and had pt assist as aable; poor balance during pericare with increaed risk of falls     Functional mobility during ADLs: Moderate assistance;Rolling walker;Cueing for safety;Cueing for sequencing       Vision       Perception     Praxis      Cognition Arousal/Alertness: Awake/alert Behavior During Therapy: Anxious Overall Cognitive Status: Impaired/Different from baseline Area of Impairment: Attention;Memory;Safety/judgement;Awareness                   Current Attention Level: Selective Memory: Decreased short-term memory Following Commands: Follows one step commands with increased time Safety/Judgement: Decreased awareness of safety;Decreased awareness of deficits Awareness: Emergent Problem Solving: Slow processing General Comments: unawae of being incontinent        Exercises Other Exercises Other Exercises: pursed lip breathing Other Exercises: flutter valve x 10 Other Exercises: incentive spirometer   Shoulder Instructions       General Comments  Pertinent Vitals/ Pain       Pain Assessment: 0-10 Pain Score: 8  Pain Location: feet Pain Descriptors / Indicators: Aching;Burning Pain Intervention(s): Limited activity within patient's tolerance  Home Living                                          Prior Functioning/Environment              Frequency  Min 2X/week        Progress Toward Goals  OT Goals(current goals can now be found in the care plan section)   Progress towards OT goals: Progressing toward goals  Acute Rehab OT Goals Patient Stated Goal: Get better OT Goal Formulation: With patient Time For Goal Achievement: 08/15/20 Potential to Achieve Goals: Good ADL Goals Pt Will Perform Grooming: with modified independence;sitting Pt Will Perform Upper Body Dressing: with set-up;with supervision;sitting Pt Will Perform Lower Body Dressing: with set-up;with supervision;sit to/from stand Pt Will Transfer to Toilet: with min guard assist;ambulating;regular height toilet Pt Will Perform Toileting - Clothing Manipulation and hygiene: with min guard assist;sitting/lateral leans;sit to/from stand Additional ADL Goal #1: Pt will monitor SpO2 and implement purse lip breathing during ADLs with Min cues Additional ADL Goal #2: Pt will verbalize three energy conservation techniques for ADLs with Min cues  Plan Discharge plan remains appropriate    Co-evaluation                 AM-PAC OT "6 Clicks" Daily Activity     Outcome Measure   Help from another person eating meals?: A Little Help from another person taking care of personal grooming?: A Little Help from another person toileting, which includes using toliet, bedpan, or urinal?: Total Help from another person bathing (including washing, rinsing, drying)?: A Lot Help from another person to put on and taking off regular upper body clothing?: A Little Help from another person to put on and taking off regular lower body clothing?: A Lot 6 Click Score: 14    End of Session Equipment Utilized During Treatment: Gait belt;Rolling walker;Oxygen (2L with 2 extensions)  OT Visit Diagnosis: Unsteadiness on feet (R26.81);Other abnormalities of gait and mobility (R26.89);Muscle weakness (generalized) (M62.81);Other symptoms and signs involving cognitive function;Pain Pain - part of body: Ankle and joints of foot (B)   Activity Tolerance Patient tolerated treatment well   Patient Left in  chair;with call bell/phone within reach;with chair alarm set   Nurse Communication Mobility status;Other (comment) (encourage OOB to chair)        Time: 8416-6063 OT Time Calculation (min): 32 min  Charges: OT General Charges $OT Visit: 1 Visit OT Treatments $Self Care/Home Management : 23-37 mins  Luisa Dago, OT/L   Acute OT Clinical Specialist Acute Rehabilitation Services Pager [phone] Office [phone]    [facility] 08/03/2020, 4:04 PM

## 2020-08-03 NOTE — Progress Notes (Signed)
ANTICOAGULATION CONSULT NOTE - Follow Up Consult  Pharmacy Consult for Heparin Indication: DVT  No Known Allergies  Patient Measurements: Height: 5\' 7"  (170.2 cm) Weight: 43.1 kg (95 lb) IBW/kg (Calculated) : 66.1 Heparin Dosing Weight:  43.1 kg  Vital Signs: Temp: 98.6 F (37 C) (08/18 0430) Temp Source: Oral (08/18 0430) BP: 154/89 (08/18 0430) Pulse Rate: 85 (08/18 0430)  Labs: Recent Labs    08/01/20 0152 08/01/20 1542 08/01/20 2146 08/02/20 0500 08/02/20 0700 08/02/20 2001 08/03/20 0437  HGB 13.3  --   --  13.7  --   --  13.7  HCT 40.6  --   --  41.3  --   --  41.6  PLT 224  --   --  217  --   --  212  HEPARINUNFRC 0.98*   < >   < >  --  <0.10* <0.10* <0.10*  CREATININE 1.57*  --   --  1.58*  --   --  1.51*   < > = values in this interval not displayed.    Estimated Creatinine Clearance: 30.5 mL/min (A) (by C-G formula based on SCr of 1.51 mg/dL (H)).  Assessment: Anticoag: New DVT. F/u oral AC. Prob going to be apixaban Ddimer 3.67 - HL 0.98>>0.15 major drop? Initial HL may have been elevated from bolus effect. RN reports no infusion problems.  8/17 AM update:  -Heparin level low -CBC good  Goal of Therapy:  Heparin level 0.3-0.7 units/ml Monitor platelets by anticoagulation protocol: Yes   Plan:  Heparin 1500 units BOLUS Increase heparin infusion to 1100 units/hr Recheck heparin level in 6-8 hours  9/17, PharmD, BCPS Clinical Pharmacist Phone: [phone]

## 2020-08-03 NOTE — H&P (View-Only) (Signed)
[facility]  Subjective  -no complaints.   Objective (!) 154/89 85 98.6 F (37 C) (Oral) 18 100%  Intake/Output Summary (Last 24 hours) at 08/03/2020 0903 Last data filed at 08/03/2020 8366 Gross per 24 hour  Intake 121.48 ml  Output 2400 ml  Net -2278.52 ml    Palpable femoral pulses bilaterally. 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  This is in the setting of E. coli bacteremia from UTI sepsis as well as Covid pneumonia.  Covid test is positive from August 09, 2020.  Plan aortogram and bilateral lower extremity arteriogram tomorrow in the OR.  Initial focus will be on the left leg for intervention given worse tissue loss.  Discussed possible first and second toe amputations.  Will likely need staged intervention on the right leg at a later date given open wound where his toenail fell off on right great toe.  Arterial duplex earlier in the week showed right lower extremity runoff in the AT PT and left leg runoff in the AT.  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  We will try and use CO2 for most of the case but will need limited contrast to evaluate tibials.  His creatinine has improved back to his baseline in the setting of AKI on CKD.  Discussed with him he is at high risk for limb loss given critical limb ischemia.  Heparin can be  held on call with the OR tomorrow.  Please keep n.p.o. after midnight.  Case will be at the end of the day given Covid positive status.  Cephus Shelling 08/03/2020 9:03 AM --

## 2020-08-03 NOTE — Progress Notes (Signed)
ANTICOAGULATION CONSULT NOTE - Follow Up Consult  Pharmacy Consult for Heparin Indication: DVT  No Known Allergies  Patient Measurements: Height: 5\' 7"  (170.2 cm) Weight: 43.1 kg (95 lb) IBW/kg (Calculated) : 66.1 Heparin Dosing Weight:  43.1 kg  Vital Signs: Temp: 97.4 F (36.3 C) (08/18 1410) Temp Source: Oral (08/18 1410) BP: 133/81 (08/18 1410) Pulse Rate: 79 (08/18 0800)  Labs: Recent Labs    08/01/20 0152 08/01/20 1542 08/02/20 0500 08/02/20 0700 08/02/20 2001 08/03/20 0437 08/03/20 1510  HGB 13.3  --  13.7  --   --  13.7  --   HCT 40.6  --  41.3  --   --  41.6  --   PLT 224  --  217  --   --  212  --   HEPARINUNFRC 0.98*   < >  --    < > <0.10* <0.10* 0.20*  CREATININE 1.57*  --  1.58*  --   --  1.51*  --    < > = values in this interval not displayed.    Estimated Creatinine Clearance: 30.5 mL/min (A) (by C-G formula based on SCr of 1.51 mg/dL (H)).  Assessment: 64 yr old male admitted with sepsis, COVID, found to have new DVT and started on IV heparin.   Heparin level ~9 hrs after heparin 1500 units IV bolus X 1 and increasing heparin infusion to 1100 units/hr was 0.20 units/ml, which is below the goal range for this pt. CBC WNL. Per RN, no issues with IV or bleeding observed.  Per vascular note, plan aortogram, bilateral LE arteriogram tomorrow in OR; heparin to be held on call to OR (case will be at end of day since COVID positive)  Goal of Therapy:  Heparin level 0.3-0.7 units/ml Monitor platelets by anticoagulation protocol: Yes   Plan:  Heparin 650 units IV bolus X 1 Increase heparin infusion to 1200 units/hr Check heparin level in ~7 hrs Hold heparin infusion on call to OR tomorrow Monitor daily heparin level, CBC Monitor for signs/symptoms of bleeding F/U transition to oral anticoagulant when abel  64, PharmD, BCPS, [facility] Clinical Pharmacist 08/03/20, 16:00 PM

## 2020-08-03 NOTE — Progress Notes (Signed)
[facility] [place]  Subjective  -no complaints.   Objective (!) 154/89 85 98.6 F (37 C) (Oral) 18 100%  Intake/Output Summary (Last 24 hours) at 08/03/2020 0903 Last data filed at 08/03/2020 0605 Gross per 24 hour  Intake 121.48 ml  Output 2400 ml  Net -2278.52 ml    Palpable femoral pulses bilaterally. 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  This is in the setting of E. coli bacteremia from UTI sepsis as well as Covid pneumonia.  Covid test is positive from 08/10/2020.  Plan aortogram and bilateral lower extremity arteriogram tomorrow in the OR.  Initial focus will be on the left leg for intervention given worse tissue loss.  Discussed possible first and second toe amputations.  Will likely need staged intervention on the right leg at a later date given open wound where his toenail fell off on right great toe.  Arterial duplex earlier in the week showed right lower extremity runoff in the AT PT and left leg runoff in the AT.  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  We will try and use CO2 for most of the case but will need limited contrast to evaluate tibials.  His creatinine has improved back to his baseline in the setting of AKI on CKD.  Discussed with him he is at high risk for limb loss given critical limb ischemia.  Heparin can be  held on call with the OR tomorrow.  Please keep n.p.o. after midnight.  Case will be at the end of the day given Covid positive status.  Illyanna Petillo J Zeena Starkel 08/03/2020 9:03 AM --   

## 2020-08-04 ENCOUNTER — Inpatient Hospital Stay (HOSPITAL_COMMUNITY): Payer: HRSA Program | Admitting: Anesthesiology

## 2020-08-04 ENCOUNTER — Inpatient Hospital Stay (HOSPITAL_COMMUNITY): Payer: HRSA Program

## 2020-08-04 ENCOUNTER — Inpatient Hospital Stay (HOSPITAL_COMMUNITY): Payer: HRSA Program | Admitting: Certified Registered"

## 2020-08-04 ENCOUNTER — Encounter (HOSPITAL_COMMUNITY): Payer: Self-pay | Admitting: Internal Medicine

## 2020-08-04 ENCOUNTER — Encounter (HOSPITAL_COMMUNITY): Admission: EM | Disposition: E | Payer: Self-pay | Source: Home / Self Care | Attending: Internal Medicine

## 2020-08-04 DIAGNOSIS — U071 COVID-19: Secondary | ICD-10-CM | POA: Diagnosis not present

## 2020-08-04 DIAGNOSIS — A4189 Other specified sepsis: Secondary | ICD-10-CM | POA: Diagnosis not present

## 2020-08-04 HISTORY — PX: ULTRASOUND GUIDANCE FOR VASCULAR ACCESS: SHX6516

## 2020-08-04 HISTORY — PX: LOWER EXTREMITY ANGIOGRAM: SHX5508

## 2020-08-04 HISTORY — PX: ANGIOPLASTY: SHX39

## 2020-08-04 LAB — GLUCOSE, CAPILLARY
Glucose-Capillary: 158 mg/dL — ABNORMAL HIGH (ref 70–99)
Glucose-Capillary: 133 mg/dL — ABNORMAL HIGH (ref 70–99)
Glucose-Capillary: 113 mg/dL — ABNORMAL HIGH (ref 70–99)
Glucose-Capillary: 221 mg/dL — ABNORMAL HIGH (ref 70–99)

## 2020-08-04 LAB — CBC WITH DIFFERENTIAL/PLATELET
Abs Immature Granulocytes: 0.29 10*3/uL — ABNORMAL HIGH (ref 0.00–0.07)
Basophils Absolute: 0 10*3/uL (ref 0.0–0.1)
Basophils Relative: 0 %
Eosinophils Absolute: 0 10*3/uL (ref 0.0–0.5)
Eosinophils Relative: 0 %
HCT: 39.6 % (ref 39.0–52.0)
Hemoglobin: 13 g/dL (ref 13.0–17.0)
Immature Granulocytes: 3 %
Lymphocytes Relative: 6 %
Lymphs Abs: 0.7 10*3/uL (ref 0.7–4.0)
MCH: 28.6 pg (ref 26.0–34.0)
MCHC: 32.8 g/dL (ref 30.0–36.0)
MCV: 87.2 fL (ref 80.0–100.0)
Monocytes Absolute: 0.6 10*3/uL (ref 0.1–1.0)
Monocytes Relative: 6 %
Neutro Abs: 9.6 10*3/uL — ABNORMAL HIGH (ref 1.7–7.7)
Neutrophils Relative %: 85 %
Platelets: 215 10*3/uL (ref 150–400)
RBC: 4.54 MIL/uL (ref 4.22–5.81)
RDW: 13.9 % (ref 11.5–15.5)
WBC: 11.2 10*3/uL — ABNORMAL HIGH (ref 4.0–10.5)
nRBC: 0 % (ref 0.0–0.2)

## 2020-08-04 LAB — HEPARIN LEVEL (UNFRACTIONATED)
Heparin Unfractionated: 1.8 IU/mL — ABNORMAL HIGH (ref 0.30–0.70)
Heparin Unfractionated: >2.2 IU/mL — ABNORMAL HIGH (ref 0.30–0.70)
Heparin Unfractionated: 1.58 IU/mL — ABNORMAL HIGH (ref 0.30–0.70)

## 2020-08-04 LAB — FERRITIN: Ferritin: 1339 ng/mL — ABNORMAL HIGH (ref 24–336)

## 2020-08-04 LAB — MAGNESIUM: Magnesium: 1.4 mg/dL — ABNORMAL LOW (ref 1.7–2.4)

## 2020-08-04 LAB — C-REACTIVE PROTEIN: CRP: 5.1 mg/dL — ABNORMAL HIGH (ref <1.0)

## 2020-08-04 LAB — PHOSPHORUS: Phosphorus: 2.8 mg/dL (ref 2.5–4.6)

## 2020-08-04 LAB — D-DIMER, QUANTITATIVE: D-Dimer, Quant: 1.61 ug/mL-FEU — ABNORMAL HIGH (ref 0.00–0.50)

## 2020-08-04 SURGERY — ANGIOGRAM, LOWER EXTREMITY
Anesthesia: Monitor Anesthesia Care | Site: Groin | Laterality: Right

## 2020-08-04 SURGERY — CANCELLED PROCEDURE
Anesthesia: General

## 2020-08-04 MED ORDER — PROPOFOL 10 MG/ML IV BOLUS
INTRAVENOUS | Status: AC
  Filled 2020-08-04: qty 40

## 2020-08-04 MED ORDER — IODIXANOL 320 MG/ML IV SOLN
INTRAVENOUS | Status: DC | PRN
  Administered 2020-08-04: 30 mL

## 2020-08-04 MED ORDER — LIDOCAINE HCL (PF) 1 % IJ SOLN
INTRAMUSCULAR | Status: AC
  Filled 2020-08-04: qty 30

## 2020-08-04 MED ORDER — 0.9 % SODIUM CHLORIDE (POUR BTL) OPTIME
TOPICAL | Status: DC | PRN
  Administered 2020-08-04: 1000 mL

## 2020-08-04 MED ORDER — PHENYLEPHRINE HCL-NACL 10-0.9 MG/250ML-% IV SOLN
INTRAVENOUS | Status: DC | PRN
  Administered 2020-08-04: 50 ug/min via INTRAVENOUS

## 2020-08-04 MED ORDER — MAGNESIUM SULFATE 2 GM/50ML IV SOLN
2.0000 g | Freq: Once | INTRAVENOUS | Status: AC
  Administered 2020-08-04: 2 g via INTRAVENOUS
  Filled 2020-08-04: qty 50

## 2020-08-04 MED ORDER — SODIUM CHLORIDE 0.9 % IV SOLN
INTRAVENOUS | Status: DC | PRN
  Administered 2020-08-04: 500 mL

## 2020-08-04 MED ORDER — LIDOCAINE HCL (PF) 1 % IJ SOLN
INTRAMUSCULAR | Status: DC | PRN
  Administered 2020-08-04: 5 mL

## 2020-08-04 MED ORDER — PROTAMINE SULFATE 10 MG/ML IV SOLN
INTRAVENOUS | Status: DC | PRN
  Administered 2020-08-04 (×2): 10 mg via INTRAVENOUS

## 2020-08-04 MED ORDER — ENSURE ENLIVE PO LIQD
237.0000 mL | Freq: Three times a day (TID) | ORAL | Status: DC
  Administered 2020-08-05 – 2020-08-10 (×8): 237 mL via ORAL

## 2020-08-04 MED ORDER — PROPOFOL 500 MG/50ML IV EMUL
INTRAVENOUS | Status: DC | PRN
  Administered 2020-08-04: 25 ug/kg/min via INTRAVENOUS

## 2020-08-04 MED ORDER — LACTATED RINGERS IV SOLN: INTRAVENOUS | Status: DC | PRN

## 2020-08-04 MED ORDER — HEPARIN (PORCINE) 25000 UT/250ML-% IV SOLN
1000.0000 [IU]/h | INTRAVENOUS | Status: DC
  Administered 2020-08-05 (×2): 1000 [IU]/h via INTRAVENOUS
  Filled 2020-08-04: qty 250

## 2020-08-04 MED ORDER — SODIUM CHLORIDE 0.9 % IV SOLN
INTRAVENOUS | Status: AC
  Filled 2020-08-04: qty 1.2

## 2020-08-04 MED ORDER — MIDAZOLAM HCL 5 MG/5ML IJ SOLN
INTRAMUSCULAR | Status: DC | PRN
  Administered 2020-08-04 (×2): .5 mg via INTRAVENOUS

## 2020-08-04 MED ORDER — HEPARIN (PORCINE) 25000 UT/250ML-% IV SOLN
1000.0000 [IU]/h | INTRAVENOUS | Status: DC
  Administered 2020-08-04: 1000 [IU]/h via INTRAVENOUS

## 2020-08-04 MED ORDER — HEPARIN SODIUM (PORCINE) 1000 UNIT/ML IJ SOLN
INTRAMUSCULAR | Status: DC | PRN
  Administered 2020-08-04: 5000 [IU] via INTRAVENOUS

## 2020-08-04 MED ORDER — FENTANYL CITRATE (PF) 250 MCG/5ML IJ SOLN
INTRAMUSCULAR | Status: AC
  Filled 2020-08-04: qty 5

## 2020-08-04 MED ORDER — FENTANYL CITRATE (PF) 100 MCG/2ML IJ SOLN
INTRAMUSCULAR | Status: DC | PRN
Start: 2020-08-04 — End: 2020-08-04
  Administered 2020-08-04 (×3): 25 ug via INTRAVENOUS

## 2020-08-04 MED ORDER — MIDAZOLAM HCL 2 MG/2ML IJ SOLN
INTRAMUSCULAR | Status: AC
  Filled 2020-08-04: qty 2

## 2020-08-04 SURGICAL SUPPLY — 65 items
BAG SNAP BAND KOVER 36X36 (MISCELLANEOUS) ×1 IMPLANT
BLADE AVERAGE 25X9 (BLADE) IMPLANT
BLADE SAW SGTL 81X20 HD (BLADE) IMPLANT
BLADE SURG 11 STRL SS (BLADE) ×1 IMPLANT
BNDG ELASTIC 4X5.8 VLCR STR LF (GAUZE/BANDAGES/DRESSINGS) ×1 IMPLANT
BNDG GAUZE ELAST 4 BULKY (GAUZE/BANDAGES/DRESSINGS) ×1 IMPLANT
CANISTER SUCT 3000ML PPV (MISCELLANEOUS) ×1 IMPLANT
CATH ANGIO 5F BER2 65CM (CATHETERS) IMPLANT
CATH OMNI FLUSH .035X70CM (CATHETERS) IMPLANT
CATH OMNI FLUSH 5F 65CM (CATHETERS) ×1 IMPLANT
CHLORAPREP W/TINT 26 (MISCELLANEOUS) ×1 IMPLANT
COVER BACK TABLE 80X110 HD (DRAPES) ×2 IMPLANT
COVER DOME SNAP 22 D (MISCELLANEOUS) ×1 IMPLANT
COVER PROBE W GEL 5X96 (DRAPES) ×1 IMPLANT
COVER SURGICAL LIGHT HANDLE (MISCELLANEOUS) ×1 IMPLANT
COVER WAND RF STERILE (DRAPES) ×1 IMPLANT
DERMABOND ADVANCED (GAUZE/BANDAGES/DRESSINGS)
DERMABOND ADVANCED .7 DNX12 (GAUZE/BANDAGES/DRESSINGS) ×2 IMPLANT
DEVICE TORQUE KENDALL .025-038 (MISCELLANEOUS) IMPLANT
DRAPE EXTREMITY T 121X128X90 (DISPOSABLE) ×1 IMPLANT
DRAPE FEMORAL ANGIO 80X135IN (DRAPES) ×1 IMPLANT
DRAPE HALF SHEET 40X57 (DRAPES) ×1 IMPLANT
DRAPE IMP U-DRAPE 54X76 (DRAPES) IMPLANT
ELECT REM PT RETURN 9FT ADLT (ELECTROSURGICAL)
ELECTRODE REM PT RTRN 9FT ADLT (ELECTROSURGICAL) ×1 IMPLANT
FILTER CO2 0.2 MICRON (VASCULAR PRODUCTS) IMPLANT
FILTER CO2 INSUFFLATOR AX1008 (MISCELLANEOUS) IMPLANT
GAUZE 4X4 16PLY RFD (DISPOSABLE) ×1 IMPLANT
GAUZE SPONGE 4X4 12PLY STRL (GAUZE/BANDAGES/DRESSINGS) ×1 IMPLANT
GLOVE BIO SURGEON STRL SZ7.5 (GLOVE) ×1 IMPLANT
GOWN STRL REUS W/ TWL LRG LVL3 (GOWN DISPOSABLE) ×2 IMPLANT
GOWN STRL REUS W/ TWL XL LVL3 (GOWN DISPOSABLE) ×1 IMPLANT
GOWN STRL REUS W/TWL LRG LVL3 (GOWN DISPOSABLE)
GOWN STRL REUS W/TWL XL LVL3 (GOWN DISPOSABLE)
GUIDEWIRE ANGLED .035X150CM (WIRE) IMPLANT
KIT BASIN OR (CUSTOM PROCEDURE TRAY) ×1 IMPLANT
KIT TURNOVER KIT B (KITS) ×1 IMPLANT
NDL HYPO 25GX1X1/2 BEV (NEEDLE) IMPLANT
NDL PERC 18GX7CM (NEEDLE) ×1 IMPLANT
NEEDLE HYPO 25GX1X1/2 BEV (NEEDLE) IMPLANT
NEEDLE PERC 18GX7CM (NEEDLE) IMPLANT
NS IRRIG 1000ML POUR BTL (IV SOLUTION) ×1 IMPLANT
PACK ENDO MINOR (CUSTOM PROCEDURE TRAY) IMPLANT
PACK GENERAL/GYN (CUSTOM PROCEDURE TRAY) ×1 IMPLANT
PACK SURGICAL SETUP 50X90 (CUSTOM PROCEDURE TRAY) ×1 IMPLANT
PAD ARMBOARD 7.5X6 YLW CONV (MISCELLANEOUS) ×2 IMPLANT
PROTECTION STATION PRESSURIZED (MISCELLANEOUS)
RESERVOIR CO2 (VASCULAR PRODUCTS) IMPLANT
SET FLUSH CO2 (MISCELLANEOUS) IMPLANT
SET MICROPUNCTURE 5F STIFF (MISCELLANEOUS) ×1 IMPLANT
SHEATH AVANTI 11CM 5FR (SHEATH) IMPLANT
SHEATH PINNACLE 5F 10CM (SHEATH) IMPLANT
STATION PROTECTION PRESSURIZED (MISCELLANEOUS) ×1 IMPLANT
STOPCOCK MORSE 400PSI 3WAY (MISCELLANEOUS) ×1 IMPLANT
SUT ETHILON 3 0 PS 1 (SUTURE) ×1 IMPLANT
SYR 10ML LL (SYRINGE) ×4 IMPLANT
SYR 20ML LL LF (SYRINGE) ×2 IMPLANT
SYR 30ML LL (SYRINGE) ×1 IMPLANT
SYR CONTROL 10ML LL (SYRINGE) IMPLANT
SYR MEDRAD MARK V 150ML (SYRINGE) IMPLANT
TOWEL GREEN STERILE (TOWEL DISPOSABLE) ×2 IMPLANT
TUBING HIGH PRESSURE 120CM (CONNECTOR) IMPLANT
UNDERPAD 30X36 HEAVY ABSORB (UNDERPADS AND DIAPERS) ×1 IMPLANT
WATER STERILE IRR 1000ML POUR (IV SOLUTION) ×1 IMPLANT
WIRE BENTSON .035X145CM (WIRE) ×1 IMPLANT

## 2020-08-04 SURGICAL SUPPLY — 79 items
BAG SNAP BAND KOVER 36X36 (MISCELLANEOUS) IMPLANT
BALL STERLING OTW 2.5X150X150 (BALLOONS) ×1
BALLN STERLING OTW 2.5X150X150 (BALLOONS) ×4
BALLOON STRLNG OTW 2.5X150X150 (BALLOONS) ×4 IMPLANT
BLADE AVERAGE 25X9 (BLADE) IMPLANT
BLADE SAW SGTL 81X20 HD (BLADE) IMPLANT
BLADE SURG 11 STRL SS (BLADE) IMPLANT
BNDG ELASTIC 4X5.8 VLCR STR LF (GAUZE/BANDAGES/DRESSINGS) ×5 IMPLANT
BNDG GAUZE ELAST 4 BULKY (GAUZE/BANDAGES/DRESSINGS) IMPLANT
CANISTER SUCT 3000ML PPV (MISCELLANEOUS) ×5 IMPLANT
CATH ANGIO 5F BER2 65CM (CATHETERS) IMPLANT
CATH CXI SUPP 2.6F 150 ANG (CATHETERS) ×5 IMPLANT
CATH OMNI FLUSH .035X70CM (CATHETERS) ×5 IMPLANT
CATH OMNI FLUSH 5F 65CM (CATHETERS) ×5 IMPLANT
CATH QUICKCROSS SUPP .035X90CM (MICROCATHETER) ×5 IMPLANT
CHLORAPREP W/TINT 26 (MISCELLANEOUS) ×5 IMPLANT
CLOSURE MYNX CONTROL 6F/7F (Vascular Products) ×5 IMPLANT
COVER BACK TABLE 80X110 HD (DRAPES) ×10 IMPLANT
COVER DOME SNAP 22 D (MISCELLANEOUS) IMPLANT
COVER PROBE W GEL 5X96 (DRAPES) ×5 IMPLANT
COVER SURGICAL LIGHT HANDLE (MISCELLANEOUS) ×5 IMPLANT
COVER WAND RF STERILE (DRAPES) ×5 IMPLANT
DECANTER SPIKE VIAL GLASS SM (MISCELLANEOUS) ×10 IMPLANT
DERMABOND ADVANCED (GAUZE/BANDAGES/DRESSINGS) ×2
DERMABOND ADVANCED .7 DNX12 (GAUZE/BANDAGES/DRESSINGS) ×8 IMPLANT
DEVICE TORQUE KENDALL .025-038 (MISCELLANEOUS) IMPLANT
DRAPE EXTREMITY T 121X128X90 (DISPOSABLE) ×5 IMPLANT
DRAPE FEMORAL ANGIO 80X135IN (DRAPES) ×5 IMPLANT
DRAPE HALF SHEET 40X57 (DRAPES) ×5 IMPLANT
DRAPE ORTHO SPLIT 77X108 STRL (DRAPES) ×1
DRAPE SURG ORHT 6 SPLT 77X108 (DRAPES) ×4 IMPLANT
DRSG TEGADERM 2-3/8X2-3/4 SM (GAUZE/BANDAGES/DRESSINGS) ×5 IMPLANT
ELECT REM PT RETURN 9FT ADLT (ELECTROSURGICAL) ×5
ELECTRODE REM PT RTRN 9FT ADLT (ELECTROSURGICAL) ×4 IMPLANT
FILTER CO2 0.2 MICRON (VASCULAR PRODUCTS) IMPLANT
FILTER CO2 INSUFFLATOR AX1008 (MISCELLANEOUS) IMPLANT
GAUZE 4X4 16PLY RFD (DISPOSABLE) ×5 IMPLANT
GAUZE SPONGE 2X2 8PLY STRL LF (GAUZE/BANDAGES/DRESSINGS) ×4 IMPLANT
GAUZE SPONGE 4X4 12PLY STRL (GAUZE/BANDAGES/DRESSINGS) ×5 IMPLANT
GLIDEWIRE ADV .035X260CM (WIRE) ×5 IMPLANT
GLOVE BIO SURGEON STRL SZ7.5 (GLOVE) IMPLANT
GOWN STRL REUS W/ TWL LRG LVL3 (GOWN DISPOSABLE) ×24 IMPLANT
GOWN STRL REUS W/ TWL XL LVL3 (GOWN DISPOSABLE) ×4 IMPLANT
GOWN STRL REUS W/TWL LRG LVL3 (GOWN DISPOSABLE) ×6
GOWN STRL REUS W/TWL XL LVL3 (GOWN DISPOSABLE) ×1
GUIDEWIRE ANGLED .035X150CM (WIRE) IMPLANT
KIT BASIN OR (CUSTOM PROCEDURE TRAY) ×5 IMPLANT
KIT ENCORE 26 ADVANTAGE (KITS) ×5 IMPLANT
KIT TURNOVER KIT B (KITS) ×5 IMPLANT
NEEDLE HYPO 25GX1X1/2 BEV (NEEDLE) ×5 IMPLANT
NEEDLE PERC 18GX7CM (NEEDLE) ×5 IMPLANT
NS IRRIG 1000ML POUR BTL (IV SOLUTION) ×5 IMPLANT
PACK ENDO MINOR (CUSTOM PROCEDURE TRAY) ×5 IMPLANT
PACK GENERAL/GYN (CUSTOM PROCEDURE TRAY) IMPLANT
PACK SURGICAL SETUP 50X90 (CUSTOM PROCEDURE TRAY) ×5 IMPLANT
PAD ARMBOARD 7.5X6 YLW CONV (MISCELLANEOUS) ×10 IMPLANT
PENCIL BUTTON HOLSTER BLD 10FT (ELECTRODE) ×5 IMPLANT
PROTECTION STATION PRESSURIZED (MISCELLANEOUS) ×5
SET FLUSH CO2 (MISCELLANEOUS) IMPLANT
SET MICROPUNCTURE 5F STIFF (MISCELLANEOUS) ×5 IMPLANT
SHEATH AVANTI 11CM 5FR (SHEATH) IMPLANT
SHEATH HIGHFLEX ANSEL 6FRX55 (SHEATH) ×5 IMPLANT
SHEATH PINNACLE 5F 10CM (SHEATH) ×5 IMPLANT
SPONGE GAUZE 2X2 STER 10/PKG (GAUZE/BANDAGES/DRESSINGS) ×1
STATION PROTECTION PRESSURIZED (MISCELLANEOUS) ×4 IMPLANT
STOPCOCK MORSE 400PSI 3WAY (MISCELLANEOUS) ×10 IMPLANT
SUT ETHILON 3 0 PS 1 (SUTURE) ×10 IMPLANT
SYR 10ML LL (SYRINGE) ×20 IMPLANT
SYR 20ML LL LF (SYRINGE) ×10 IMPLANT
SYR 30ML LL (SYRINGE) ×5 IMPLANT
SYR CONTROL 10ML LL (SYRINGE) ×10 IMPLANT
SYR MEDRAD MARK V 150ML (SYRINGE) IMPLANT
TOWEL GREEN STERILE (TOWEL DISPOSABLE) ×10 IMPLANT
TUBE CONNECTING 12X1/4 (SUCTIONS) ×5 IMPLANT
TUBING HIGH PRESSURE 120CM (CONNECTOR) IMPLANT
UNDERPAD 30X36 HEAVY ABSORB (UNDERPADS AND DIAPERS) ×5 IMPLANT
WATER STERILE IRR 1000ML POUR (IV SOLUTION) ×5 IMPLANT
WIRE BENTSON .035X145CM (WIRE) ×5 IMPLANT
WIRE G V18X300CM (WIRE) ×15 IMPLANT

## 2020-08-04 NOTE — Progress Notes (Signed)
Patient brought back from vascular procedure and is somewhat sleepy but easily awakened and alert to where he is.  Patient Heparin started back up along with LR continued.  Patient denies any pain at this time.  Patient was on NRB mask, that was removed & patient was placed back on [place] at 3 LPM with his SpO2 at 91%. Patients only complaint was he was cold. Brought warm blankets from warmer to patient and then also placed a blanket roll next to his right leg to stop him from moving it until after 8 pm tonight.  Patient is instructed to not move right let until 8 pm and must keep straight.  Patient call bell in reach, bed low and locked, bed alarm is on. Patient phone in reach as well.  Patient has no further requests.

## 2020-08-04 NOTE — Progress Notes (Signed)
Patient's sister returned call. She stated that she has found a notary that is willing to come to the hospital possibly tomorrow and witness patient sign his Durable POA paperwork over Facetime and then have the RN bring the paperwork downstairs so it can be notarized. CSW advised RN of the same.   Mackay Hanauer LCSW

## 2020-08-04 NOTE — Progress Notes (Addendum)
Patient off the floor for procedure. Patient was not given his Nepro at 9:57 this morning. Error in charting was given but he was not given this. I went back in and made an edit to show not given.

## 2020-08-04 NOTE — Anesthesia Preprocedure Evaluation (Signed)
Anesthesia Evaluation  Patient identified by MRN, date of birth, ID band Patient awake    Reviewed: Allergy & Precautions, NPO status , Patient's Chart, lab work & pertinent test results  History of Anesthesia Complications Negative for: history of anesthetic complications  Airway Mallampati: II  TM Distance: >3 FB Neck ROM: Full    Dental  (+) Dental Advisory Given, Missing, Edentulous Upper   Pulmonary pneumonia, unresolved,    Pulmonary exam normal        Cardiovascular + Peripheral Vascular Disease  Normal cardiovascular exam     Neuro/Psych negative neurological ROS  negative psych ROS   GI/Hepatic negative GI ROS, Neg liver ROS,   Endo/Other  diabetes, Type 2 Hypocalcemia Hypomagnesemia   Renal/GU CRFRenal disease     Musculoskeletal negative musculoskeletal ROS (+)   Abdominal   Peds  Hematology negative hematology ROS (+)   Anesthesia Other Findings Covid+  Reproductive/Obstetrics                             Anesthesia Physical Anesthesia Plan  ASA: III  Anesthesia Plan: MAC   Post-op Pain Management:    Induction: Intravenous  PONV Risk Score and Plan: 1 and Propofol infusion and Treatment may vary due to age or medical condition  Airway Management Planned: Nasal Cannula and Natural Airway  Additional Equipment: None  Intra-op Plan:   Post-operative Plan:   Informed Consent: I have reviewed the patients History and Physical, chart, labs and discussed the procedure including the risks, benefits and alternatives for the proposed anesthesia with the patient or authorized representative who has indicated his/her understanding and acceptance.       Plan Discussed with: CRNA and Anesthesiologist  Anesthesia Plan Comments: (Patient had liquid supplement at 10am, will postpone procedure til 4pm. Otherwise, consented and ready for procedure )         Anesthesia Quick Evaluation

## 2020-08-04 NOTE — Progress Notes (Signed)
Called by RN who reported pt complained of chest pain that he reported with 7 out of 10.  Did not report it was radiating and had no other symptoms of shortness of breath diaphoresis nausea or vomiting.  Chest pain lasted a few minutes and spontaneously resolved.  Chest pain is now gone.  Vital signs are stable patient is resting comfortably. Check EKG and obtain serial troponins

## 2020-08-04 NOTE — Anesthesia Preprocedure Evaluation (Deleted)
Anesthesia Evaluation    Reviewed: Allergy & Precautions, Patient's Chart, lab work & pertinent test results  Airway        Dental   Pulmonary neg pulmonary ROS,           Cardiovascular negative cardio ROS       Neuro/Psych negative neurological ROS  negative psych ROS   GI/Hepatic negative GI ROS, Neg liver ROS,   Endo/Other  diabetes  Renal/GU Renal InsufficiencyRenal disease     Musculoskeletal negative musculoskeletal ROS (+)   Abdominal   Peds  Hematology negative hematology ROS (+)   Anesthesia Other Findings   Reproductive/Obstetrics                             Anesthesia Physical Anesthesia Plan  ASA: II  Anesthesia Plan: General   Post-op Pain Management:    Induction: Intravenous  PONV Risk Score and Plan: 3 and Ondansetron, Dexamethasone and Midazolam  Airway Management Planned: Oral ETT  Additional Equipment: None  Intra-op Plan:   Post-operative Plan: Extubation in OR  Informed Consent:   Plan Discussed with: CRNA  Anesthesia Plan Comments: (Cancelled due to NPO status)       Anesthesia Quick Evaluation

## 2020-08-04 NOTE — Progress Notes (Signed)
PT Cancellation Note  Patient Details Name: Todd Farrell MRN: [id number] DOB: [birth year]   Cancelled Treatment:    Reason Eval/Treat Not Completed: Patient at procedure or test/unavailable. Pt in surgery.   [name] [facility] 08/01/2020, 1:53 PM Skip Mayer PT Acute Rehabilitation Services Pager [phone] Office [phone]

## 2020-08-04 NOTE — Plan of Care (Signed)
°  Problem: Education: Goal: Knowledge of General Education information will improve Description: Including pain rating scale, medication(s)/side effects and non-pharmacologic comfort measures Outcome: Not Progressing   Problem: Health Behavior/Discharge Planning: Goal: Ability to manage health-related needs will improve Outcome: Not Progressing   Problem: Clinical Measurements: Goal: Ability to maintain clinical measurements within normal limits will improve Outcome: Not Progressing Goal: Will remain free from infection Outcome: Not Progressing Goal: Diagnostic test results will improve Outcome: Not Progressing Goal: Respiratory complications will improve Outcome: Not Progressing Goal: Cardiovascular complication will be avoided Outcome: Not Progressing   Problem: Activity: Goal: Risk for activity intolerance will decrease Outcome: Not Progressing   Problem: Nutrition: Goal: Adequate nutrition will be maintained Outcome: Not Progressing   Problem: Coping: Goal: Level of anxiety will decrease Outcome: Not Progressing   Problem: Elimination: Goal: Will not experience complications related to bowel motility Outcome: Not Progressing Goal: Will not experience complications related to urinary retention Outcome: Not Progressing   Problem: Pain Managment: Goal: General experience of comfort will improve Outcome: Not Progressing   Problem: Skin Integrity: Goal: Risk for impaired skin integrity will decrease Outcome: Not Progressing   Problem: Safety: Goal: Ability to remain free from injury will improve Outcome: Not Progressing

## 2020-08-04 NOTE — Progress Notes (Signed)
ANTICOAGULATION CONSULT NOTE - Follow Up Consult  Pharmacy Consult for Heparin Indication: DVT  No Known Allergies  Patient Measurements: Height: 5\' 7"  (170.2 cm) Weight: 43.1 kg (95 lb) IBW/kg (Calculated) : 66.1 Heparin Dosing Weight:  43.1 kg  Vital Signs: Temp: 97.3 F (36.3 C) (08/19 1700) Temp Source: Axillary (08/19 1700) BP: 113/71 (08/19 1700) Pulse Rate: 85 (08/19 1700)  Labs: Recent Labs    08/02/20 0500 08/02/20 0700 08/03/20 0437 08/03/20 0437 08/03/20 1510 08/03/20 2350 08/10/2020 0710  HGB 13.7   < > 13.7  --   --   --  13.0  HCT 41.3  --  41.6  --   --   --  39.6  PLT 217  --  212  --   --   --  215  HEPARINUNFRC  --    < > <0.10*   < > 0.20* 1.80* >2.20*  CREATININE 1.58*  --  1.51*  --   --   --   --    < > = values in this interval not displayed.    Estimated Creatinine Clearance: 30.1 mL/min (A) (by C-G formula based on SCr of 1.51 mg/dL (H)).  Assessment: 64 yr old male admitted with sepsis, COVID, found to have new DVT and started on IV heparin. Pt also has critical bilateral lower ischemia with wounds.  Heparin level was elevated this AM, so heparin was held X 1 hr, then rate decreased to 1000 units/hr. Pt then went for arteriogram late this afternoon; per RN, no bleeding observed post procedure. Dr. 64 said pt can restart heparin infusion 8 hrs after procedure at prior infusion rate. H/H, platelets WNL today. Per Vascular note, plan to repeat angiography on RLE.  Goal of Therapy:  Heparin level 0.3-0.7 units/ml Monitor platelets by anticoagulation protocol: Yes   Plan:  Resume heparin infusion at 1000 units/hr 8 hrs after procedure (resume at 0230 AM on 8/20) Check heparin level ~7 hrs after heparin infusion resumed Monitor daily heparin level, CBC Monitor for signs/symptoms of bleeding F/U transition to oral anticoagulant when able  9/20, PharmD, BCPS, [facility] Clinical Pharmacist 08/08/2020 8:19 PM

## 2020-08-04 NOTE — Progress Notes (Signed)
Notified Dr. Rachael Darby patient c/o 7/10 intermitted chest pain, non radiating. Resolved after .  VSS. Orders placed. EKG completed. Will continue to monitor.

## 2020-08-04 NOTE — Progress Notes (Signed)
Nutrition Follow-up  DOCUMENTATION CODES:   Underweight  INTERVENTION:  Once diet advances, provide Ensure Enlive po TID, each supplement provides 350 kcal and 20 grams of protein.  NUTRITION DIAGNOSIS:   Increased nutrient needs related to catabolic illness (pneumonia secondary COVID-19 virus infection) as evidenced by estimated needs; ongoing  GOAL:   Patient will meet greater than or equal to 90% of their needs; progressing  MONITOR:   Labs, I & O's, Skin, Diet advancement, PO intake, Weight trends, Supplement acceptance  REASON FOR ASSESSMENT:   Malnutrition Screening Tool, Consult Assessment of nutrition requirement/status  ASSESSMENT:   64 year old male with past medical history of DM2, HLD, vitamin D deficiency, benign prostatic hyperplasia, CKD stage IIIb admitted with sepsis secondary to UTI and pneumonia due to COVID-19 virus infection presented with AMS, severe lethargy and found to be hypothermic and hypoxic. Pt with E. coli bacteremia due to UTI.  Pt currently NPO for procedure today. Pt to undergo angiogram. Pt had been previously on a dysphagia 3 diet with thin liquids with meal completion 25-50%. Pt with Nepro shake ordered and good consumption prior to NPO. Once diet advances, recommend switching supplementation to Ensure TID to aid in caloric and protein needs.   Labs and medications reviewed.   Diet Order:   Diet Order            Diet NPO time specified  Diet effective midnight                 EDUCATION NEEDS:   Not appropriate for education at this time  Skin:  Skin Assessment: Reviewed RN Assessment Skin Integrity Issues:: Diabetic Ulcer Diabetic Ulcer: Left;Right anterior toe  Last BM:  8/18  Height:   Ht Readings from Last 1 Encounters:  07/28/20 5\' 7"  (1.702 m)    Weight:   Wt Readings from Last 1 Encounters:  07/28/20 43.1 kg    Ideal Body Weight:  67.3 kg  BMI:  Body mass index is 14.88 kg/m.  Estimated Nutritional  Needs:   Kcal:  1510-1750  Protein:  75-88 grams  Fluid:  > 1.5 L  09/27/20, MS, RD, LDN RD pager number/after hours weekend pager number on Amion.

## 2020-08-04 NOTE — Transfer of Care (Deleted)
Immediate Anesthesia Transfer of Care Note  Patient: Todd Farrell  Procedure(s) Performed: CANCELLED PROCEDURE  Patient Location: [phone]  Anesthesia Type:No anesthetic administered, case cancelled  Level of Consciousness: awake, alert  and oriented  Airway & Oxygen Therapy: Patient Spontanous Breathing and Patient connected to nasal cannula oxygen  Post-op Assessment: Report given to RN  Post vital signs: Reviewed and stable  Last Vitals:  Vitals Value Taken Time  BP    Temp    Pulse 80 08/03/2020 1440  Resp    SpO2 96 % 08/07/2020 1440  Vitals shown include unvalidated device data.  Last Pain:  Vitals:   08/02/2020 0834  TempSrc: Oral  PainSc:       Patients Stated Pain Goal: 0 (08/02/20 1000)  Complications: No complications documented.

## 2020-08-04 NOTE — Anesthesia Procedure Notes (Deleted)
Date/Time: 07/29/2020 2:40 PM Performed by: Elliot Dally, CRNA Pre-anesthesia Checklist: Patient identified, Emergency Drugs available, Suction available, Patient being monitored and Timeout performed Oxygen Delivery Method: Nasal cannula

## 2020-08-04 NOTE — Interval H&P Note (Signed)
History and Physical Interval Note:  08/27/2020 10:57 AM  Todd Farrell  has presented today for surgery, with the diagnosis of Perpherial Artery Disease .  The various methods of treatment have been discussed with the patient and family. After consideration of risks, benefits and other options for treatment, the patient has consented to  Procedure(s): LOWER EXTREMITY ANGIOGRAM (N/A) AORTOGRAM (N/A) POSSIBLE AMPUTATION 1ST AND 2ND TOE LEFT (Left) as a surgical intervention.  The patient's history has been reviewed, patient examined, no change in status, stable for surgery.  I have reviewed the patient's chart and labs.  Questions were answered to the patient's satisfaction.     Lemar Livings, MD

## 2020-08-04 NOTE — Progress Notes (Signed)
PROGRESS NOTE    Todd Farrell  ZOX:[id number] DOB: 1956-05-14 DOA: 2020-08-19 PCP: Patient, No Pcp Per    Brief Narrative:  64 year old male with history of type 2 diabetes on insulin, hyperlipidemia, vitamin D deficiency, BPH, chronic kidney disease stage IIIb, peripheral vascular disease who presented to the emergency room with confusion and lethargy. Patient was hypothermic and hypoxic.  In the emergency room, he was tachycardic and leukocytosis with severe lactic acidosis and evidence of DKA.  Treated with IV fluids and insulin drip, broad-spectrum antibiotics. COVID-19 positive.  Blood cultures with E. coli.  Urine culture with insignificant growth.  Clinically improving.   Assessment & Plan:   Principal Problem:   Sepsis due to COVID-19 [facility]) Active Problems:   Diabetic ketoacidosis without coma associated with type 2 diabetes mellitus (HCC)   Lactic acidosis   Acute respiratory failure with hypoxia (HCC)   Acute renal failure superimposed on stage 3b chronic kidney disease (HCC)   Acute metabolic encephalopathy   Hyperkalemia, diminished renal excretion   Severe protein-calorie malnutrition (HCC)   Prolonged QT interval  Sepsis present on admission due to multiple sources of infection: Sepsis physiology improving.  Continue to treat infection.  E. coli bacteremia due to UTI: Treated with IV fluids.  Treated with ceftriaxone then changed to IV cefazolin now changed to Augmentin.  Will treat with total 10 days of therapy. Stable. No urinary retention.  COVID-19 viral infection with hypoxia: Pulmonary symptoms improved.  He is on dexamethasone, will discontinue total 10 days of therapy. Continue breathing exercises and mobility.  Peripheral vascular disease with arterial ulcers: Significant ulcerations of the toes, ABI with bilateral obstruction.  Vascular surgery consulted.  Undergoing angiogram with runoff today.  Acute DVT , right posterior tibial acute DVT: Present on  admission.  Currently on heparin.  Will change to Eliquis after procedures.    COVID-19 Labs  Recent Labs    08/02/20 0500 08/03/20 0437 07/18/2020 0710  DDIMER 3.20* 2.71* 1.61*  FERRITIN 1,045* 1,088* 1,339*  CRP 6.4* 8.7* 5.1*    Lab Results  Component Value Date   SARSCOV2NAA POSITIVE (A) 2020-08-19    SpO2: 93 % O2 Flow Rate (L/min): 2 L/min   Diabetic ketoacidosis: Uncontrolled type 2 diabetes.  Hemoglobin A1c 14.  Unsure about taking any medicine before admission.  He says he buys insulin and uses regularly. We will continue to monitor and adjust doses. Patient will need long-acting insulin, start with Lantus 5 units at night.  Acute metabolic encephalopathy: Improved.  Nutrition Status: Nutrition Problem: Increased nutrient needs Etiology: catabolic illness (pneumonia secondary COVID-19 virus infection) Signs/Symptoms: estimated needs Interventions: MVI, Hormel Shake, Nepro shake  Acute kidney injury on CKD stage IIIb: Improved with IV fluids.  Presented with creatinine of more than 4, improved with IV fluid resuscitation.  No evidence of obstruction.  Profound physical debility: Work with PT OT.  Recommended rehab.  Acute DVT right posterior tibial veins: On heparin drip.  Continue until surgical procedures.  Hypomagnesemia: persistent low, more replacement today   DVT prophylaxis: Heparin infusion   Code Status: Full code Family Communication: Daughter on the phone as patient requested. Disposition Plan: Status is: Inpatient  Remains inpatient appropriate because:IV treatments appropriate due to intensity of illness or inability to take PO   Dispo: The patient is from: Home              Anticipated d/c is to: SNF vs home with home therapies.  Anticipated d/c date is: 1-2 days              Patient currently is not medically stable to d/c.  Consultants:   Vascular surgery  Procedures:   None  Antimicrobials:  Antibiotics Given  (last 72 hours)    Date/Time Action Medication Dose   08/01/20 2157 Given   amoxicillin-clavulanate (AUGMENTIN) 875-125 MG per tablet 1 tablet 1 tablet   08/02/20 0954 Given   amoxicillin-clavulanate (AUGMENTIN) 875-125 MG per tablet 1 tablet 1 tablet   08/02/20 1925 Given   amoxicillin-clavulanate (AUGMENTIN) 500-125 MG per tablet 500 mg 500 mg   08/03/20 0955 Given   amoxicillin-clavulanate (AUGMENTIN) 500-125 MG per tablet 500 mg 500 mg   08/03/20 2109 Given   amoxicillin-clavulanate (AUGMENTIN) 500-125 MG per tablet 500 mg 500 mg   2020-09-01 0956 Given   amoxicillin-clavulanate (AUGMENTIN) 500-125 MG per tablet 500 mg 500 mg         Subjective: Patient was seen and examined.  Today [birth year] his birthday. He was looking forward to go to procedure.  Was more alert and communicating today.  Objective: Vitals:   08/03/20 1410 08/03/20 2000 09-01-20 0401 2020-09-01 0834  BP: 133/81 101/66 (!) 158/85 (!) 166/91  Pulse:  100 74 76  Resp: 20 18 20 18   Temp: (!) 97.4 F (36.3 C) 97.8 F (36.6 C) 98.1 F (36.7 C) 98.4 F (36.9 C)  TempSrc: Oral Oral Oral Oral  SpO2:  95% 92% 93%  Weight:      Height:        Intake/Output Summary (Last 24 hours) at 09-01-20 1428 Last data filed at 2020-09-01 0645 Gross per 24 hour  Intake 1416.11 ml  Output 800 ml  Net 616.11 ml   Filed Weights   07/28/20 0012  Weight: 43.1 kg    Examination:  General exam: Appears calm and comfortable  Very frail looking gentleman, not in any distress. Respiratory system: Clear to auscultation. Respiratory effort normal.  Some conducted airway sounds. Cardiovascular system: S1 & S2 heard, RRR.  Gastrointestinal system: Abdomen is nondistended, soft and nontender. No organomegaly or masses felt. Normal bowel sounds heard. Central nervous system: Alert and oriented. No focal neurological deficits.  Generalized weakness. Extremities: Symmetric 5 x 5 power. Skin: No pedal pulse present.  Bilateral toe  ulcerations and dry gangrene. Open ulcer right great toe Black discoloration toes of left foot.    Data Reviewed: I have personally reviewed following labs and imaging studies  CBC: Recent Labs  Lab 07/31/20 0635 08/01/20 0152 08/02/20 0500 08/03/20 0437 2020/09/01 0710  WBC 15.6* 10.9* 11.6* 11.9* 11.2*  NEUTROABS 13.9* 9.3* 9.5* 10.1* 9.6*  HGB 16.3 13.3 13.7 13.7 13.0  HCT 49.7 40.6 41.3 41.6 39.6  MCV 87.8 86.9 87.5 87.0 87.2  PLT 271 224 217 212 215   Basic Metabolic Panel: Recent Labs  Lab 07/29/20 0226 07/31/20 0635 08/01/20 0152 08/02/20 0500 08/03/20 0437 09-01-2020 0710  NA 145 148* 143 142 141  --   K 4.1 3.4* 3.4* 3.9 4.2  --   CL 105 109 108 108 106  --   CO2 27 24 23 22 22   --   GLUCOSE 296* 96 261* 265* 163*  --   BUN 90* 56* 45* 39* 32*  --   CREATININE 2.56* 1.76* 1.57* 1.58* 1.51*  --   CALCIUM 8.0* 8.0* 7.0* 7.4* 7.5*  --   MG  --  1.5* 1.7 1.4* 1.4* 1.4*  PHOS  --   --   --   --   --  2.8   GFR: Estimated Creatinine Clearance: 30.1 mL/min (A) (by C-G formula based on SCr of 1.51 mg/dL (H)). Liver Function Tests: Recent Labs  Lab 07/31/20 0635 08/01/20 0152 08/02/20 0500 08/03/20 0437  AST 35 23 27 33  ALT 13 15 15 17   ALKPHOS 73 68 81 82  BILITOT 0.7 0.8 0.7 1.1  PROT 6.0* 4.8* 5.2* 5.3*  ALBUMIN 1.8* 1.4* 1.6* 1.5*   No results for input(s): LIPASE, AMYLASE in the last 168 hours. Recent Labs  Lab 07/28/20 1700  AMMONIA 25   Coagulation Profile: No results for input(s): INR, PROTIME in the last 168 hours. Cardiac Enzymes: No results for input(s): CKTOTAL, CKMB, CKMBINDEX, TROPONINI in the last 168 hours. BNP (last 3 results) No results for input(s): PROBNP in the last 8760 hours. HbA1C: No results for input(s): HGBA1C in the last 72 hours. CBG: Recent Labs  Lab 08/03/20 1922 08/03/20 2107 08/03/20 2253 07/19/2020 0726 08/02/2020 1222  GLUCAP 253* 193* 107* 158* 133*   Lipid Profile: No results for input(s): CHOL, HDL,  LDLCALC, TRIG, CHOLHDL, LDLDIRECT in the last 72 hours. Thyroid Function Tests: No results for input(s): TSH, T4TOTAL, FREET4, T3FREE, THYROIDAB in the last 72 hours. Anemia Panel: Recent Labs    08/03/20 0437 07/26/2020 0710  FERRITIN 1,088* 1,339*   Sepsis Labs: Recent Labs  Lab 07/28/20 1700 07/28/20 2212  LATICACIDVEN 3.2* 2.0*    Recent Results (from the past 240 hour(s))  SARS Coronavirus 2 by RT PCR (hospital order, performed in [facility] hospital lab) Nasopharyngeal Nasopharyngeal Swab     Status: Abnormal   Collection Time: 08-13-20 11:17 PM   Specimen: Nasopharyngeal Swab  Result Value Ref Range Status   SARS Coronavirus 2 POSITIVE (A) NEGATIVE Final    Comment: RESULT CALLED TO, READ BACK BY AND VERIFIED WITH: 09/26/20 RN 07/28/20 0221 JDW (NOTE) SARS-CoV-2 target nucleic acids are DETECTED  SARS-CoV-2 RNA is generally detectable in upper respiratory specimens  during the acute phase of infection.  Positive results are indicative  of the presence of the identified virus, but do not rule out bacterial infection or co-infection with other pathogens not detected by the test.  Clinical correlation with patient history and  other diagnostic information is necessary to determine patient infection status.  The expected result is negative.  Fact Sheet for Patients:   09/27/20   Fact Sheet for Healthcare Providers:   BoilerBrush.com.cy    This test is not yet approved or cleared by the https://pope.com/ FDA and  has been authorized for detection and/or diagnosis of SARS-CoV-2 by FDA under an Emergency Use Authorization (EUA).  This EUA will remain in effect (meaning this test c an be used) for the duration of  the COVID-19 declaration under Section 564(b)(1) of the Act, 21 U.S.C. section 360-bbb-3(b)(1), unless the authorization is terminated or revoked sooner.  Performed at [facility], [address], [place], [place]   Culture, blood (x 2)     Status: Abnormal   Collection Time: 13-Aug-2020 11:29 PM   Specimen: BLOOD LEFT WRIST  Result Value Ref Range Status   Specimen Description BLOOD LEFT WRIST  Final   Special Requests   Final    BOTTLES DRAWN AEROBIC ONLY Blood Culture results may not be optimal due to an inadequate volume of blood received in culture bottles   Culture  Setup Time   Final    GRAM NEGATIVE RODS AEROBIC BOTTLE ONLY Organism ID to follow CRITICAL RESULT  CALLED TO, READ BACK BY AND VERIFIED WITHKelby Fam PHARMD 1901 07/28/20 A BROWNING Performed at [facility], [address] [place], [place] [address]    Culture ESCHERICHIA COLI (A)  Final   Report Status 07/30/2020 FINAL  Final   Organism ID, Bacteria ESCHERICHIA COLI  Final      Susceptibility   Escherichia coli - MIC*    AMPICILLIN <=2 SENSITIVE Sensitive     CEFAZOLIN <=4 SENSITIVE Sensitive     CEFEPIME <=0.12 SENSITIVE Sensitive     CEFTAZIDIME <=1 SENSITIVE Sensitive     CEFTRIAXONE <=0.25 SENSITIVE Sensitive     CIPROFLOXACIN <=0.25 SENSITIVE Sensitive     GENTAMICIN <=1 SENSITIVE Sensitive     IMIPENEM <=0.25 SENSITIVE Sensitive     TRIMETH/SULFA <=20 SENSITIVE Sensitive     AMPICILLIN/SULBACTAM <=2 SENSITIVE Sensitive     PIP/TAZO <=4 SENSITIVE Sensitive     * ESCHERICHIA COLI  Blood Culture ID Panel (Reflexed)     Status: Abnormal   Collection Time: 08/01/2020 11:29 PM  Result Value Ref Range Status   Enterococcus faecalis NOT DETECTED NOT DETECTED Final   Enterococcus Faecium NOT DETECTED NOT DETECTED Final   Listeria monocytogenes NOT DETECTED NOT DETECTED Final   Staphylococcus species NOT DETECTED NOT DETECTED Final   Staphylococcus aureus (BCID) NOT DETECTED NOT DETECTED Final   Staphylococcus epidermidis NOT DETECTED NOT DETECTED Final   Staphylococcus lugdunensis NOT DETECTED NOT DETECTED Final   Streptococcus species NOT DETECTED NOT DETECTED Final   Streptococcus  agalactiae NOT DETECTED NOT DETECTED Final   Streptococcus pneumoniae NOT DETECTED NOT DETECTED Final   Streptococcus pyogenes NOT DETECTED NOT DETECTED Final   A.calcoaceticus-baumannii NOT DETECTED NOT DETECTED Final   Bacteroides fragilis NOT DETECTED NOT DETECTED Final   Enterobacterales DETECTED (A) NOT DETECTED Final    Comment: Enterobacterales represent a large order of gram negative bacteria, not a single organism.   Enterobacter cloacae complex NOT DETECTED NOT DETECTED Final   Escherichia coli DETECTED (A) NOT DETECTED Final    Comment: CRITICAL RESULT CALLED TO, READ BACK BY AND VERIFIED WITH: Kelby Fam PHARMD 1901 07/28/20 A BROWNING    Klebsiella aerogenes NOT DETECTED NOT DETECTED Final   Klebsiella oxytoca NOT DETECTED NOT DETECTED Final   Klebsiella pneumoniae NOT DETECTED NOT DETECTED Final   Proteus species NOT DETECTED NOT DETECTED Final   Salmonella species NOT DETECTED NOT DETECTED Final   Serratia marcescens NOT DETECTED NOT DETECTED Final   Haemophilus influenzae NOT DETECTED NOT DETECTED Final   Neisseria meningitidis NOT DETECTED NOT DETECTED Final   Pseudomonas aeruginosa NOT DETECTED NOT DETECTED Final   Stenotrophomonas maltophilia NOT DETECTED NOT DETECTED Final   Candida albicans NOT DETECTED NOT DETECTED Final   Candida auris NOT DETECTED NOT DETECTED Final   Candida glabrata NOT DETECTED NOT DETECTED Final   Candida krusei NOT DETECTED NOT DETECTED Final   Candida parapsilosis NOT DETECTED NOT DETECTED Final   Candida tropicalis NOT DETECTED NOT DETECTED Final   Cryptococcus neoformans/gattii NOT DETECTED NOT DETECTED Final   CTX-M ESBL NOT DETECTED NOT DETECTED Final   Carbapenem resistance IMP NOT DETECTED NOT DETECTED Final   Carbapenem resistance KPC NOT DETECTED NOT DETECTED Final   Carbapenem resistance NDM NOT DETECTED NOT DETECTED Final   Carbapenem resist OXA 48 LIKE NOT DETECTED NOT DETECTED Final   Carbapenem resistance VIM NOT DETECTED NOT  DETECTED Final    Comment: Performed at [facility], [address] [place], [place]  [address]  Urine culture     Status: Abnormal   Collection Time: 07/28/20  8:04 AM   Specimen: Urine, Catheterized  Result Value Ref Range Status   Specimen Description URINE, CATHETERIZED  Final   Special Requests NONE  Final   Culture (A)  Final    <10,000 COLONIES/mL INSIGNIFICANT GROWTH Performed at [facility], [address], [place], [place] [address]    Report Status 07/29/2020 FINAL  Final  MRSA PCR Screening     Status: None   Collection Time: 07/30/20  6:01 AM   Specimen: Nasal Mucosa; Nasopharyngeal  Result Value Ref Range Status   MRSA by PCR NEGATIVE NEGATIVE Final    Comment:        The GeneXpert MRSA Assay (FDA approved for NASAL specimens only), is one component of a comprehensive MRSA colonization surveillance program. It is not intended to diagnose MRSA infection nor to guide or monitor treatment for MRSA infections. Performed at [facility], [address], [place], [place] [address]          Radiology Studies: VAS [place] ABI WITH/WO TBI  Result Date: 08/02/2020 LOWER EXTREMITY DOPPLER STUDY Indications: Ulceration, and bilateral toe ulcers.  Limitations: Today's exam was limited due to bandages. Performing Technologist: Blanch Media RVS  Examination Guidelines: A complete evaluation includes at minimum, Doppler waveform signals and systolic blood pressure reading at the level of bilateral brachial, anterior tibial, and posterior tibial arteries, when vessel segments are accessible. Bilateral testing is considered an integral part of a complete examination. Photoelectric Plethysmograph (PPG) waveforms and toe systolic pressure readings are included as required and additional duplex testing as needed. Limited examinations for reoccurring indications may be performed as noted.  ABI Findings: +--------+------------------+-----+---------+--------+ Right   Rt  Pressure (mmHg)IndexWaveform Comment  +--------+------------------+-----+---------+--------+ [id number]                    triphasic         +--------+------------------+-----+---------+--------+ PTA                            absent            +--------+------------------+-----+---------+--------+ DP                             absent            +--------+------------------+-----+---------+--------+ +--------+------------------+-----+---------+-------+ Left    Lt Pressure (mmHg)IndexWaveform Comment +--------+------------------+-----+---------+-------+ [id number]                    triphasic        +--------+------------------+-----+---------+-------+ PTA                            absent           +--------+------------------+-----+---------+-------+ DP                             absent           +--------+------------------+-----+---------+-------+ +-------+-----------+-----------+------------+------------+ ABI/TBIToday's ABIToday's TBIPrevious ABIPrevious TBI +-------+-----------+-----------+------------+------------+ Right  0.56                                           +-------+-----------+-----------+------------+------------+  Summary: Right: Resting  right ankle-brachial index indicates moderate right lower extremity arterial disease. Left: Resting left ankle-brachial index indicates critical left limb ischemia.  *See table(s) above for measurements and observations.  Electronically signed by Waverly Ferrarihristopher Dickson MD on 08/02/2020 at 5:09:30 PM.   Final    HYBRID OR IMAGING ([facility] ONLY)  Result Date: 2020-12-08 There is no interpretation for this exam.  This order is for images obtained during a surgical procedure.  Please See "Surgeries" Tab for more information regarding the procedure.        Scheduled Meds: . albuterol  2 puff Inhalation Q6H  . amoxicillin-clavulanate  1 tablet Oral BID  . dexamethasone  6 mg Oral Daily  . feeding  supplement (NEPRO CARB STEADY)  237 mL Oral BID BM  . insulin aspart  0-15 Units Subcutaneous TID WC  . insulin glargine  5 Units Subcutaneous QHS  . multivitamin with minerals  1 tablet Oral Daily   Continuous Infusions: . heparin 1,000 Units/hr (2020-05-07 1000)  . lactated ringers 125 mL/hr at 2020-05-07 0836     LOS: 7 days    Time spent: 30 minutes    Dorcas CarrowKuber Calynn Ferrero, MD [facility] Pager [phone]

## 2020-08-04 NOTE — Progress Notes (Signed)
SLP Cancellation Note  Patient Details Name: Eisen Robenson MRN: [id number] DOB: [birth year]   Cancelled treatment:        NPO for angiogram today at 1600   Royce Macadamia 08/05/2020, 3:57 PM  Breck Coons Lonell Face.Ed Nurse, children's [phone] Office [phone]

## 2020-08-04 NOTE — Op Note (Signed)
Patient name: Todd Farrell MRN: [id number] DOB: 09/27/1956 Sex: male  07/31/2020 Pre-operative Diagnosis: critical bilateral lower extremity ischemia with wounds Post-operative diagnosis:  Same Surgeon:  Eda Paschal. Donzetta Matters, MD Procedure Performed: 1.  US guided cannulation of right common femoral artery 2.  CO2 aortogram and bilateral lower extremity angiography with CO2 and contrast 3.  Plain balloon angioplasty of left AT with 2.45m balloon 4.  Mynx device closure of right common femoral artery  Indications: 64year old male with bilateral lower extremity wounds the worst of which are on the left.  By duplex appears to have disease below the knees also has palpable popliteal pulses bilaterally.  He does also have elevated creatinine although he is at baseline 1.5 after presenting with Covid pneumonia and acute kidney injury.  He is now indicated for angiography with possible intervention on the left lower extremity 1st.  Patient has not consented to amputation of the toes.  Findings: Aorta and iliac arteries are heavily calcified however free of flow-limiting stenosis.  Bilateral common femoral and SFAs are also heavily calcified can be seen with fluoroscopy without any contrast.  CO2 appears to flow through the bilateral SFAs without any hold-up.  On the right side he appears to have initial flow from an AT and peroneal.  I did not give him contrast in his right lower extremity could not evaluate the tibials any further.  On the left side the anterior tibial artery runs off all the way to about the ankle level where it then becomes quite diminutive approximately 80% stenosed in 1 area and then very small.  After balloon angioplasty it is much larger and the 80% stenosis is resolved there is no dissection.  Unfortunately the flow on the foot appears to stop just after the ankle.  We also attempted to cross the peroneal artery which occludes after the tibioperoneal trunk is patent.  We were unable to  cross it there was initially a perforation this appeared to seal by completion of the procedure.  Patient will be high risk for below-knee amputation on the left.  We will need to repeat angiography of the right lower extremity and hopes of salvage given the lesser degree of wounds on the right side.   Procedure:  The patient was identified in the holding area and taken to room 16.  The patient was then placed supine on the table and prepped and draped in the usual sterile fashion.  MAC anesthesia was induced A time out was called.  Ultrasound was used to evaluate the right common femoral artery.  The artery was calcified although patent and the area was anesthetized with 1% lidocaine.  We then cannulated the artery with direct ultrasound visualization with micropuncture needle followed by wire and sheath.  An image was saved the permanent record.  We then placed a Glidewire advantage and is 5 FPakistansheath.  Omni catheter was placed to the level of L1 and CO2 aortogram was performed.  We then crossed the bifurcation with the Omni catheter performed left lower extremity angiography with CO2 to the level of the knee.  Using Glidewire advantage we then placed a quick cross catheter to the level of the knee and performed contrasted angiography below the knee with the above findings.  We then placed a long 6 French sheath the patient was fully heparinized.  We used CXI catheter V 18 wire initially to attempt to cross the peroneal artery.  After multiple attempts I did demonstrate a perforation.  I  redirected my attempts toward the anterior tibial artery.  Were able to get all the way to the foot across the ankle mortise.  We then performed balloon angioplasty with 2.5 mm balloon.  At completion there did appear to be significant spasm.  I then reattempted crossing the peroneal and performed further angiography which demonstrated no further perforation.  After this I then again selected the anterior tibial artery  performed direct angiography which demonstrated no residual stenosis and flow to the level of the foot however the foot does not fill briskly there is not appear to be flow into the distal foot.  With this we removed the catheter.  We exchanged over wire for short 6 French sheath and perform right lower extremity angiography with CO2 to the level of just below the knee where we demonstrated the anterior tibial and peroneal to be the runoff but I did not want to use any further contrast given his recent acute kidney injury.  We then deployed a minx device in the right common femoral artery which deployed well.  He was awakened from anesthesia having tolerated procedure without any complication.   Contrast: 30cc  Aubriana Ravelo C. Donzetta Matters, MD [facility] Office: [phone] Pager: [phone]

## 2020-08-04 NOTE — Progress Notes (Signed)
CSW received voicemail from patient's sister. CSW returned call and left voicemail.   Rhianna Raulerson LCSW

## 2020-08-04 NOTE — Anesthesia Procedure Notes (Signed)
Procedure Name: MAC Date/Time: 07/18/2020 4:33 PM Performed by: Imagene Riches, CRNA Pre-anesthesia Checklist: Patient identified, Emergency Drugs available, Suction available, Patient being monitored and Timeout performed Patient Re-evaluated:Patient Re-evaluated prior to induction

## 2020-08-04 NOTE — Transfer of Care (Signed)
Immediate Anesthesia Transfer of Care Note  Patient: Todd Farrell  Procedure(s) Performed: CO 2 AORTOGRM BILATERAL LOWER EXTREMITY ANGIOGRAPHY WITH CO2 AND CONTRAST (Bilateral Groin) ULTRASOUND GUIDANCE FOR CANNULATON OF RIGHT COMMON FEMORAL ARTERY (Right Groin) BALLOON ANGIOPLASTY OF LEFT AT WITH 2.5 MM BALLOON MYNX DEVICE CLOSURE OF RIGHT COMMON FEMORAL ARTERY (Left Groin)  Patient Location: ICU  Anesthesia Type:General  Level of Consciousness: awake, alert  and oriented  Airway & Oxygen Therapy: Patient Spontanous Breathing  Post-op Assessment: Report given to RN and Post -op Vital signs reviewed and stable  Post vital signs: Reviewed and stable  Last Vitals:  Vitals Value Taken Time  BP    Temp    Pulse    Resp    SpO2      Last Pain:  Vitals:   08/07/2020 1700  TempSrc: Axillary  PainSc:       Patients Stated Pain Goal: 0 (08/02/20 1000)  Complications: No complications documented.

## 2020-08-04 NOTE — Progress Notes (Signed)
Upon shift change - right AC IV infiltrated - IV team consulted and new IV started in right upper arm.  Pt stated he felt dizzy and unwell - FSBS @1922  = 253  MD notified and order given for 5 units of insulin with recheck in one hour  Pt denied any further complaints.  FSBS @ 2108 = 193  MD notified with order to hold bedtime units of insulin if recheck <200. FSBS @ 2254 = 107 - bedtime insulin not given.  0400 Right upper arm IV infiltrated - IV Team consulted  Pt vitals stable and pt has no complaints or concerns at this time.  Will continue to monitor pt.  2109, RN

## 2020-08-04 NOTE — Progress Notes (Signed)
Spoke with Ulyses Southward Q, RPH-CPP regarding Heparin to stop drip for one hour and then new rate will be issued in [facility] for this patient.    Confirmed as I repeated back.

## 2020-08-04 NOTE — Progress Notes (Signed)
ANTICOAGULATION CONSULT NOTE - Follow Up Consult  Pharmacy Consult for Heparin Indication: DVT  No Known Allergies  Patient Measurements: Height: 5\' 7"  (170.2 cm) Weight: 43.1 kg (95 lb) IBW/kg (Calculated) : 66.1 Heparin Dosing Weight:  43.1 kg  Vital Signs: Temp: 98.1 F (36.7 C) (08/19 0401) Temp Source: Oral (08/19 0401) BP: 158/85 (08/19 0401) Pulse Rate: 74 (08/19 0401)  Labs: Recent Labs    08/02/20 0500 08/02/20 0700 08/03/20 0437 08/03/20 0437 08/03/20 1510 08/03/20 2350 07/20/2020 0710  HGB 13.7   < > 13.7  --   --   --  13.0  HCT 41.3  --  41.6  --   --   --  39.6  PLT 217  --  212  --   --   --  215  HEPARINUNFRC  --    < > <0.10*   < > 0.20* 1.80* >2.20*  CREATININE 1.58*  --  1.51*  --   --   --   --    < > = values in this interval not displayed.    Estimated Creatinine Clearance: 30.1 mL/min (A) (by C-G formula based on SCr of 1.51 mg/dL (H)).  Assessment: 64 yr old male admitted with sepsis, COVID, found to have new DVT and started on IV heparin.   Heparin level ~9 hrs after heparin 1500 units IV bolus X 1 and increasing heparin infusion to 1100 units/hr was 0.20 units/ml, which is below the goal range for this pt. CBC WNL. Per RN, no issues with IV or bleeding observed.  Pt waiting for arteriogram today. Heparin level came back elevated last night and this AM. It has been consistently low previously. According to the Rn, heparin level was drawn on the same arm as infusing but lab came back and redrew it. It's still elevated. Plan is to hold heparin on-call for the procedure. We will hold and resume at a lower rate until that time.   Hgb remains stable at 13, plts wnl  Goal of Therapy:  Heparin level 0.3-0.7 units/ml Monitor platelets by anticoagulation protocol: Yes   Plan:  Hold heparin x1 hr Decrease rate to 1000 units/hr Check 8 hr HL or f/u resume post procedure Monitor daily heparin level, CBC Monitor for signs/symptoms of bleeding F/U  transition to oral anticoagulant when able  64, PharmD, BCIDP, AAHIVP, CPP Infectious Disease Pharmacist 07/22/2020 8:25 AM

## 2020-08-05 ENCOUNTER — Encounter (HOSPITAL_COMMUNITY): Payer: Self-pay | Admitting: Vascular Surgery

## 2020-08-05 DIAGNOSIS — A4189 Other specified sepsis: Secondary | ICD-10-CM | POA: Diagnosis not present

## 2020-08-05 DIAGNOSIS — U071 COVID-19: Secondary | ICD-10-CM | POA: Diagnosis not present

## 2020-08-05 LAB — CBC WITH DIFFERENTIAL/PLATELET
Abs Immature Granulocytes: 0.37 10*3/uL — ABNORMAL HIGH (ref 0.00–0.07)
Basophils Absolute: 0 10*3/uL (ref 0.0–0.1)
Basophils Relative: 0 %
Eosinophils Absolute: 0 10*3/uL (ref 0.0–0.5)
Eosinophils Relative: 0 %
HCT: 38.2 % — ABNORMAL LOW (ref 39.0–52.0)
Hemoglobin: 12.1 g/dL — ABNORMAL LOW (ref 13.0–17.0)
Immature Granulocytes: 2 %
Lymphocytes Relative: 5 %
Lymphs Abs: 0.9 10*3/uL (ref 0.7–4.0)
MCH: 28 pg (ref 26.0–34.0)
MCHC: 31.7 g/dL (ref 30.0–36.0)
MCV: 88.4 fL (ref 80.0–100.0)
Monocytes Absolute: 0.7 10*3/uL (ref 0.1–1.0)
Monocytes Relative: 4 %
Neutro Abs: 14.9 10*3/uL — ABNORMAL HIGH (ref 1.7–7.7)
Neutrophils Relative %: 89 %
Platelets: 184 10*3/uL (ref 150–400)
RBC: 4.32 MIL/uL (ref 4.22–5.81)
RDW: 14.3 % (ref 11.5–15.5)
WBC: 16.9 10*3/uL — ABNORMAL HIGH (ref 4.0–10.5)
nRBC: 0 % (ref 0.0–0.2)

## 2020-08-05 LAB — BASIC METABOLIC PANEL
Anion gap: 11 (ref 5–15)
BUN: 31 mg/dL — ABNORMAL HIGH (ref 8–23)
CO2: 14 mmol/L — ABNORMAL LOW (ref 22–32)
Calcium: 7.9 mg/dL — ABNORMAL LOW (ref 8.9–10.3)
Chloride: 108 mmol/L (ref 98–111)
Creatinine, Ser: 1.66 mg/dL — ABNORMAL HIGH (ref 0.61–1.24)
GFR calc Af Amer: 50 mL/min — ABNORMAL LOW (ref >60)
GFR calc non Af Amer: 43 mL/min — ABNORMAL LOW (ref >60)
Glucose, Bld: 282 mg/dL — ABNORMAL HIGH (ref 70–99)
Potassium: 5.2 mmol/L — ABNORMAL HIGH (ref 3.5–5.1)
Sodium: 133 mmol/L — ABNORMAL LOW (ref 135–145)

## 2020-08-05 LAB — GLUCOSE, CAPILLARY
Glucose-Capillary: 116 mg/dL — ABNORMAL HIGH (ref 70–99)
Glucose-Capillary: 119 mg/dL — ABNORMAL HIGH (ref 70–99)
Glucose-Capillary: 152 mg/dL — ABNORMAL HIGH (ref 70–99)
Glucose-Capillary: 247 mg/dL — ABNORMAL HIGH (ref 70–99)
Glucose-Capillary: 255 mg/dL — ABNORMAL HIGH (ref 70–99)

## 2020-08-05 LAB — MAGNESIUM: Magnesium: 1.9 mg/dL (ref 1.7–2.4)

## 2020-08-05 LAB — HEPARIN LEVEL (UNFRACTIONATED): Heparin Unfractionated: 1.7 IU/mL — ABNORMAL HIGH (ref 0.30–0.70)

## 2020-08-05 LAB — C-REACTIVE PROTEIN: CRP: 3.8 mg/dL — ABNORMAL HIGH (ref <1.0)

## 2020-08-05 LAB — TROPONIN I (HIGH SENSITIVITY)
Troponin I (High Sensitivity): 13 ng/L (ref <18)
Troponin I (High Sensitivity): 14 ng/L (ref <18)
Troponin I (High Sensitivity): 19 ng/L — ABNORMAL HIGH (ref <18)

## 2020-08-05 LAB — D-DIMER, QUANTITATIVE: D-Dimer, Quant: 1.2 ug/mL-FEU — ABNORMAL HIGH (ref 0.00–0.50)

## 2020-08-05 LAB — FERRITIN: Ferritin: 39 ng/mL (ref 24–336)

## 2020-08-05 MED ORDER — NITROGLYCERIN 0.4 MG SL SUBL
SUBLINGUAL_TABLET | SUBLINGUAL | Status: AC
  Filled 2020-08-05: qty 1

## 2020-08-05 MED ORDER — HEPARIN (PORCINE) 25000 UT/250ML-% IV SOLN
750.0000 [IU]/h | INTRAVENOUS | Status: DC
  Administered 2020-08-05: 750 [IU]/h via INTRAVENOUS

## 2020-08-05 NOTE — Progress Notes (Signed)
Physical Therapy Treatment Patient Details Name: Todd Farrell MRN: [id number] DOB: 1956/02/11 Today's Date: 08/05/2020    History of Present Illness Pt adm 8/12 with sepsis due to combined Covid PNA as well as UTI. Pt also with acute metabolic encephalopathy and DKA. Pt found to have RLE DVT on 8/15 and heparin initiated. Pt with angioplasty of LLE on 8/19. Pt high risk for limb loss.  PMH -  DM, ckd, BPH    PT Comments    Pt with much brighter affect today. Remains weak with limited activity tolerance. Continue to recommend SNF at DC.    Follow Up Recommendations  SNF     Equipment Recommendations  None recommended by PT    Recommendations for Other Services       Precautions / Restrictions Precautions Precautions: Fall    Mobility  Bed Mobility Overal bed mobility: Needs Assistance Bed Mobility: Supine to Sit     Supine to sit: Supervision     General bed mobility comments: Incr time and effort  Transfers Overall transfer level: Needs assistance Equipment used: 4-wheeled walker;1 person hand held assist Transfers: Sit to/from Stand Sit to Stand: Min assist         General transfer comment: Assist to bring hips up and for balance  Ambulation/Gait Ambulation/Gait assistance: Min assist Gait Distance (Feet): 3 Feet Assistive device: 4-wheeled walker;1 person hand held assist Gait Pattern/deviations: Step-through pattern;Decreased step length - right;Decreased step length - left;Shuffle;Trunk flexed Gait velocity: decr Gait velocity interpretation: <1.8 ft/sec, indicate of risk for recurrent falls General Gait Details: Assist for balance and support. Pt fatigues quickly. Had to stand for 1-2 minutes initially for pericare due to incontinent of stool.    Stairs             Wheelchair Mobility    Modified Rankin (Stroke Patients Only)       Balance Overall balance assessment: Needs assistance Sitting-balance support: No upper extremity  supported;Feet supported Sitting balance-Leahy Scale: Fair     Standing balance support: Bilateral upper extremity supported;During functional activity;Single extremity supported Standing balance-Leahy Scale: Poor Standing balance comment: UE support and min guard for static standing                            Cognition Arousal/Alertness: Awake/alert Behavior During Therapy: WFL for tasks assessed/performed Overall Cognitive Status: No family/caregiver present to determine baseline cognitive functioning Area of Impairment: Following commands;Safety/judgement;Awareness;Problem solving                       Following Commands: Follows multi-step commands inconsistently;Follows one step commands consistently Safety/Judgement: Decreased awareness of safety Awareness: Emergent Problem Solving: Slow processing;Requires verbal cues        Exercises Other Exercises Other Exercises: Incentive spirometer x 5    General Comments General comments (skin integrity, edema, etc.): Pt on RA initially with SpO2 >92%. SpO2 decr to 86-88% with activity. Replaced O2 at 2L.       Pertinent Vitals/Pain Pain Assessment: No/denies pain    Home Living                      Prior Function            PT Goals (current goals can now be found in the care plan section) Acute Rehab PT Goals Patient Stated Goal: Get better Progress towards PT goals: Not progressing toward goals - comment    Frequency  Min 2X/week      PT Plan Current plan remains appropriate;Frequency needs to be updated    Co-evaluation              AM-PAC PT "6 Clicks" Mobility   Outcome Measure  Help needed turning from your back to your side while in a flat bed without using bedrails?: None Help needed moving from lying on your back to sitting on the side of a flat bed without using bedrails?: A Little Help needed moving to and from a bed to a chair (including a wheelchair)?: A  Little Help needed standing up from a chair using your arms (e.g., wheelchair or bedside chair)?: A Little Help needed to walk in hospital room?: A Lot Help needed climbing 3-5 steps with a railing? : A Lot 6 Click Score: 17    End of Session Equipment Utilized During Treatment: Oxygen Activity Tolerance: Patient limited by fatigue Patient left: in chair;with call bell/phone within reach;with chair alarm set   PT Visit Diagnosis: Unsteadiness on feet (R26.81);Other abnormalities of gait and mobility (R26.89);Muscle weakness (generalized) (M62.81)     Time: 4627-0350 PT Time Calculation (min) (ACUTE ONLY): 20 min  Charges:  $Therapeutic Activity: 8-22 mins                    [facility] PT Acute Rehabilitation Services Pager [phone] Office [phone]    [name] [facility] 08/05/2020, 1:05 PM

## 2020-08-05 NOTE — Progress Notes (Signed)
Patient c/o 9/10 chest pain with shortness of breath and tightness. Notified Dr. Rachael Darby. Received verbal order to give one dose of nitrogylcerin SL, order repeat troponin. EKG completed and placed in chart. Patient refused nitroglycerin, felt the chest pain easing off. Educated about nitrogylcerin. Made Lafonda Mosses RN aware.

## 2020-08-05 NOTE — Progress Notes (Signed)
SLP Cancellation Note  Patient Details Name: Todd Farrell MRN: [id number] DOB: January 28, 1956   Cancelled treatment:        ST attempting to see for possible upgrade to regular. NPO yesterday for angiogram and NPO continues today. RN uncertain reason unless planning surgical intervention. Will continue efforts.    Royce Macadamia 08/05/2020, 11:37 AM  Breck Coons Lonell Face.Ed Nurse, children's [phone] Office [phone]

## 2020-08-05 NOTE — Plan of Care (Signed)

## 2020-08-05 NOTE — Progress Notes (Signed)
  Progress Note    08/05/2020 8:48 AM 1 Day Post-Op  Subjective: No overnight issues  Vitals:   08/05/20 0707 08/05/20 0803  BP: 133/83 121/79  Pulse: 79 93  Resp:  18  Temp:  97.8 F (36.6 C)  SpO2: 91% 98%    Physical Exam: Awake alert and oriented Right groin is soft without hematoma 1+ palpable left anterior tibial pulse at the ankle, there is no palpable pulse on the foot Stable gangrenous changes left first and second toe Stable wound on right great toe Bilateral palpable popliteal pulses  CBC    Component Value Date/Time   WBC 16.9 (H) 08/05/2020 0339   RBC 4.32 08/05/2020 0339   HGB 12.1 (L) 08/05/2020 0339   HCT 38.2 (L) 08/05/2020 0339   PLT 184 08/05/2020 0339   MCV 88.4 08/05/2020 0339   MCH 28.0 08/05/2020 0339   MCHC 31.7 08/05/2020 0339   RDW 14.3 08/05/2020 0339   LYMPHSABS 0.9 08/05/2020 0339   MONOABS 0.7 08/05/2020 0339   EOSABS 0.0 08/05/2020 0339   BASOSABS 0.0 08/05/2020 0339    BMET    Component Value Date/Time   NA 133 (L) 08/05/2020 0339   K 5.2 (H) 08/05/2020 0339   CL 108 08/05/2020 0339   CO2 14 (L) 08/05/2020 0339   GLUCOSE 282 (H) 08/05/2020 0339   BUN 31 (H) 08/05/2020 0339   CREATININE 1.66 (H) 08/05/2020 0339   CALCIUM 7.9 (L) 08/05/2020 0339   GFRNONAA 43 (L) 08/05/2020 0339   GFRAA 50 (L) 08/05/2020 0339    INR    Component Value Date/Time   INR 1.1 07/28/2020 0117     Intake/Output Summary (Last 24 hours) at 08/05/2020 0848 Last data filed at 08/05/2020 0612 Gross per 24 hour  Intake 2151.42 ml  Output 1770 ml  Net 381.42 ml     Assessment/plan:  64 y.o. male is s/p endovascular intervention of left anterior tibial artery.  Does have a weakly palpable anterior tibial artery pulse at the ankle I cannot feel this on the foot there is no Doppler available.  I discussed with him high risk for below-knee amputation on the left for now we are allowing his foot to demarcate.  On the right side he also has a wound  will need endovascular intervention patient would prefer to have this done while he is inpatient but will need to be done in OR given his Covid status.  We will plan this early next week prior to discharge and will further discuss possibility of amputation left first and second toe.  Jared Cahn C. Randie Heinz, MD [facility] Office: [phone] Pager: [phone]  08/05/2020 8:48 AM

## 2020-08-05 NOTE — Progress Notes (Signed)
ANTICOAGULATION CONSULT NOTE - Follow Up Consult  Pharmacy Consult for Heparin Indication: DVT  No Known Allergies  Patient Measurements: Height: 5\' 7"  (170.2 cm) Weight: 43.1 kg (95 lb) IBW/kg (Calculated) : 66.1 Heparin Dosing Weight:  43.1 kg  Vital Signs: Temp: 97.8 F (36.6 C) (08/20 1322) Temp Source: Oral (08/20 1322) BP: 125/80 (08/20 1322) Pulse Rate: 86 (08/20 1322)  Labs: Recent Labs    08/03/20 0437 08/03/20 1510 08/03/2020 0710 07/26/2020 2019 08/05/20 0014 08/05/20 0339 08/05/20 1116  HGB 13.7  --  13.0  --   --  12.1*  --   HCT 41.6  --  39.6  --   --  38.2*  --   PLT 212  --  215  --   --  184  --   HEPARINUNFRC <0.10*   < > >2.20* 1.58*  --   --  1.70*  CREATININE 1.51*  --   --   --   --  1.66*  --   TROPONINIHS  --   --   --   --  13 14 19*   < > = values in this interval not displayed.    Estimated Creatinine Clearance: 27.4 mL/min (A) (by C-G formula based on SCr of 1.66 mg/dL (H)).  Assessment: 64 yr old male admitted with sepsis, COVID, found to have new DVT and started on IV heparin. Pt also has critical bilateral lower ischemia with wounds.  Heparin level is elevated again at 1.7. Plan for possible BKA and further intervention. We will hold and reduce rate.   Goal of Therapy:  Heparin level 0.3-0.7 units/ml Monitor platelets by anticoagulation protocol: Yes   Plan:  Hold heparin x 2 hrs Decrease rate to 750 units/hr Check 8 hr heparin level Monitor daily heparin level, CBC Monitor for signs/symptoms of bleeding F/U transition to oral anticoagulant when able  64, PharmD, BCIDP, AAHIVP, CPP Infectious Disease Pharmacist 08/05/2020 2:02 PM

## 2020-08-05 NOTE — Progress Notes (Signed)
Inpatient Diabetes Program Recommendations  AACE/ADA: New Consensus Statement on Inpatient Glycemic Control (2015)  Target Ranges:  Prepandial:   less than 140 mg/dL      Peak postprandial:   less than 180 mg/dL (1-2 hours)      Critically ill patients:  140 - 180 mg/dL   Lab Results  Component Value Date   GLUCAP 119 (H) 08/05/2020   HGBA1C 14.0 (H) 07/28/2020    Review of Glycemic Control Results for Todd Farrell, Todd Farrell (MRN [id number]) as of 08/05/2020 13:20  Ref. Range 07/30/2020 07:26 08/09/2020 12:22 07/27/2020 14:49 08/12/2020 21:16 08/05/2020 00:00 08/05/2020 07:40 08/05/2020 12:02  Glucose-Capillary Latest Ref Range: 70 - 99 mg/dL 782 (H)  Novolog 3 units  Decadron 6 mg 133 (H) 113 (H) 221 (H) 247 (H)        Lantus 5 units 255 (H)  Novolog 8 units 119 (H)     Decadron 6 mg    Diabetes history: DM 2  Current orders for Inpatient glycemic control:  Lantus 5 units Novolog 0-15 units tid  Decadron 6 mg Daily Ensure Enlive tid between meals  Inpatient Diabetes Program Recommendations:    PO intake not consistent   monitor glucose trends for now.   Thanks,  Christena Deem RN, MSN, BC-ADM Inpatient Diabetes Coordinator Team Pager [phone] (8a-5p)

## 2020-08-05 NOTE — Progress Notes (Signed)
PROGRESS NOTE    Todd Farrell  ZOX:[id number] DOB: Oct 29, 1956 DOA: 2020/08/18 PCP: Patient, No Pcp Per    Brief Narrative:  64 year old male with history of type 2 diabetes on insulin, hyperlipidemia, vitamin D deficiency, BPH, chronic kidney disease stage IIIb who presented to the emergency room with confusion and lethargic.  Patient was hypothermic and hypoxic.  In the emergency room, he was tachycardic and leukocytosis with severe lactic acidosis and evidence of DKA.  Treated with IV fluids and insulin drip, broad-spectrum antibiotics. COVID-19 positive.  Blood cultures with E. coli.  Urine culture with insignificant growth.  Clinically improving. He was found with ischemic ulceration of the right toes and gangrene left toes.  Assessment & Plan:   Principal Problem:   Sepsis due to COVID-19 [facility]) Active Problems:   Diabetic ketoacidosis without coma associated with type 2 diabetes mellitus (HCC)   Lactic acidosis   Acute respiratory failure with hypoxia (HCC)   Acute renal failure superimposed on stage 3b chronic kidney disease (HCC)   Acute metabolic encephalopathy   Hyperkalemia, diminished renal excretion   Severe protein-calorie malnutrition (HCC)   Prolonged QT interval  Sepsis present on admission due to multiple sources of infection: Sepsis physiology improving.  Continue to treat infection.  E. coli bacteremia due to UTI: Treated with IV fluids.  Treated with ceftriaxone then changed to IV cefazolin now changed to Augmentin.  Will treat with total 10 days of therapy.  COVID-19 viral infection with hypoxia: Pulmonary symptoms improved.  He is on dexamethasone, will continue and 10 days.  Peripheral vascular disease with arterial ulcers: Significant ulcerations of the toes, ABI with bilateral obstruction.  Patient has ischemic ulcer on the right great toe and middle toe Patient has dry gangrene on the left great toe and second toe. Status post angiogram and intervention on  the left anterior tibial artery Planned for angiogram and intervention on the right leg next week. At risk of losing both legs. Remains on heparin until surgical procedures.  Acute DVT , right posterior tibial acute DVT: Present on admission.  Currently on heparin.  Will change to Eliquis after procedures.    COVID-19 Labs  Recent Labs    08/03/20 0437 08/06/2020 0710 08/05/20 0339  DDIMER 2.71* 1.61* 1.20*  FERRITIN 1,088* 1,339* 39  CRP 8.7* 5.1* 3.8*    Lab Results  Component Value Date   SARSCOV2NAA POSITIVE (A) 08/18/20    SpO2: 91 % O2 Flow Rate (L/min): 2 L/min   Diabetic ketoacidosis: Uncontrolled type 2 diabetes.  Hemoglobin A1c 14.  Unsure about taking any medicine before admission.  Currently started on insulin regimen.  We will continue to monitor and adjust doses. Patient will need long-acting insulin, will start with Lantus 5 units at night.  Acute metabolic encephalopathy: Improved.  Nutrition Status: Nutrition Problem: Increased nutrient needs Etiology: catabolic illness (pneumonia secondary COVID-19 virus infection) Signs/Symptoms: estimated needs Interventions: MVI, Hormel Shake, Nepro shake  Acute kidney injury on CKD stage IIIb: Improved with IV fluids.  Presented with creatinine of more than 4, improved with IV fluid resuscitation.  No evidence of obstruction.  Profound physical debility: Work with PT OT.  Recommended rehab.  DVT prophylaxis: Heparin infusion   Code Status: Full code Family Communication: Daughter Jasmine on the phone Disposition Plan: Status is: Inpatient  Remains inpatient appropriate because:IV treatments appropriate due to intensity of illness or inability to take PO   Dispo: The patient is from: Home  Anticipated d/c is to: SNF              Anticipated d/c date is: 3 days              Patient currently is not medically stable to d/c.         Consultants:   Vascular surgery  Procedures:    None  Antimicrobials:  Antibiotics Given (last 72 hours)    Date/Time Action Medication Dose   08/02/20 1925 Given   amoxicillin-clavulanate (AUGMENTIN) 500-125 MG per tablet 500 mg 500 mg   08/03/20 0955 Given   amoxicillin-clavulanate (AUGMENTIN) 500-125 MG per tablet 500 mg 500 mg   08/03/20 2109 Given   amoxicillin-clavulanate (AUGMENTIN) 500-125 MG per tablet 500 mg 500 mg   08/16/2020 0956 Given   amoxicillin-clavulanate (AUGMENTIN) 500-125 MG per tablet 500 mg 500 mg   08/05/20 1211 Given   amoxicillin-clavulanate (AUGMENTIN) 500-125 MG per tablet 500 mg 500 mg         Subjective: Patient seen and examined.  He is more alert awake and interactive today.  He knows about his poor circulation on his legs.  Today he denies any complaints. Overnight complained of chest pain with negative EKG and troponins.  No chest pain currently.  Objective: Vitals:   08/05/20 0647 08/05/20 0707 08/05/20 0803 08/05/20 1322  BP: 140/82 133/83 121/79 125/80  Pulse: 89 79 93 86  Resp:   18 17  Temp: (!) 97.3 F (36.3 C)  97.8 F (36.6 C) 97.8 F (36.6 C)  TempSrc:   Oral Oral  SpO2: 93% 91% 98% 91%  Weight:      Height:        Intake/Output Summary (Last 24 hours) at 08/05/2020 1500 Last data filed at 08/05/2020 1400 Gross per 24 hour  Intake 2151.42 ml  Output 2470 ml  Net -318.58 ml   Filed Weights   07/28/20 0012  Weight: 43.1 kg    Examination:  General exam: Appears calm and comfortable  Very frail looking gentleman, not in any distress. Respiratory system: Clear to auscultation. Respiratory effort normal.  Some conducted airway sounds. Cardiovascular system: S1 & S2 heard, RRR.  Gastrointestinal system: Abdomen is nondistended, soft and nontender. No organomegaly or masses felt. Normal bowel sounds heard. Central nervous system: Alert and oriented. No focal neurological deficits.  Generalized weakness. Extremities: Symmetric 5 x 5 power. Skin: No pedal pulse  present.   Right toe with ulceration at the tip Left toes with dry gangrene of the tips.    Data Reviewed: I have personally reviewed following labs and imaging studies  CBC: Recent Labs  Lab 08/01/20 0152 08/02/20 0500 08/03/20 0437 07/23/2020 0710 08/05/20 0339  WBC 10.9* 11.6* 11.9* 11.2* 16.9*  NEUTROABS 9.3* 9.5* 10.1* 9.6* 14.9*  HGB 13.3 13.7 13.7 13.0 12.1*  HCT 40.6 41.3 41.6 39.6 38.2*  MCV 86.9 87.5 87.0 87.2 88.4  PLT 224 217 212 215 184   Basic Metabolic Panel: Recent Labs  Lab 07/31/20 0635 07/31/20 0635 08/01/20 0152 08/02/20 0500 08/03/20 0437 08/03/2020 0710 08/05/20 0339  NA 148*  --  143 142 141  --  133*  K 3.4*  --  3.4* 3.9 4.2  --  5.2*  CL 109  --  108 108 106  --  108  CO2 24  --  --  14*  GLUCOSE 96  --  261* 265* 163*  --  282*  BUN 56*  --  45*  39* 32*  --  31*  CREATININE 1.76*  --  1.57* 1.58* 1.51*  --  1.66*  CALCIUM 8.0*  --  7.0* 7.4* 7.5*  --  7.9*  MG 1.5*   < > 1.7 1.4* 1.4* 1.4* 1.9  PHOS  --   --   --   --   --  2.8  --    < > = values in this interval not displayed.   GFR: Estimated Creatinine Clearance: 27.4 mL/min (A) (by C-G formula based on SCr of 1.66 mg/dL (H)). Liver Function Tests: Recent Labs  Lab 07/31/20 0635 08/01/20 0152 08/02/20 0500 08/03/20 0437  AST 35 23 27 33  ALT 13 15 15 17   ALKPHOS 73 68 81 82  BILITOT 0.7 0.8 0.7 1.1  PROT 6.0* 4.8* 5.2* 5.3*  ALBUMIN 1.8* 1.4* 1.6* 1.5*   No results for input(s): LIPASE, AMYLASE in the last 168 hours. No results for input(s): AMMONIA in the last 168 hours. Coagulation Profile: No results for input(s): INR, PROTIME in the last 168 hours. Cardiac Enzymes: No results for input(s): CKTOTAL, CKMB, CKMBINDEX, TROPONINI in the last 168 hours. BNP (last 3 results) No results for input(s): PROBNP in the last 8760 hours. HbA1C: No results for input(s): HGBA1C in the last 72 hours. CBG: Recent Labs  Lab 07/25/2020 1449 08/08/2020 2116 08/05/20 0000  08/05/20 0740 08/05/20 1202  GLUCAP 113* 221* 247* 255* 119*   Lipid Profile: No results for input(s): CHOL, HDL, LDLCALC, TRIG, CHOLHDL, LDLDIRECT in the last 72 hours. Thyroid Function Tests: No results for input(s): TSH, T4TOTAL, FREET4, T3FREE, THYROIDAB in the last 72 hours. Anemia Panel: Recent Labs    07/31/2020 0710 08/05/20 0339  FERRITIN 1,339* 39   Sepsis Labs: No results for input(s): PROCALCITON, LATICACIDVEN in the last 168 hours.  Recent Results (from the past 240 hour(s))  SARS Coronavirus 2 by RT PCR (hospital order, performed in [facility] hospital lab) Nasopharyngeal Nasopharyngeal Swab     Status: Abnormal   Collection Time: 07/31/2020 11:17 PM   Specimen: Nasopharyngeal Swab  Result Value Ref Range Status   SARS Coronavirus 2 POSITIVE (A) NEGATIVE Final    Comment: RESULT CALLED TO, READ BACK BY AND VERIFIED WITH: 09/26/20 RN 07/28/20 0221 JDW (NOTE) SARS-CoV-2 target nucleic acids are DETECTED  SARS-CoV-2 RNA is generally detectable in upper respiratory specimens  during the acute phase of infection.  Positive results are indicative  of the presence of the identified virus, but do not rule out bacterial infection or co-infection with other pathogens not detected by the test.  Clinical correlation with patient history and  other diagnostic information is necessary to determine patient infection status.  The expected result is negative.  Fact Sheet for Patients:   09/27/20   Fact Sheet for Healthcare Providers:   BoilerBrush.com.cy    This test is not yet approved or cleared by the https://pope.com/ FDA and  has been authorized for detection and/or diagnosis of SARS-CoV-2 by FDA under an Emergency Use Authorization (EUA).  This EUA will remain in effect (meaning this test c an be used) for the duration of  the COVID-19 declaration under Section 564(b)(1) of the Act, 21 U.S.C. section  360-bbb-3(b)(1), unless the authorization is terminated or revoked sooner.  Performed at [facility], [address], [place], [place]   Culture, blood (x 2)     Status: Abnormal   Collection Time: 08/10/2020 11:29 PM   Specimen: BLOOD LEFT WRIST  Result Value Ref Range Status   Specimen Description BLOOD LEFT WRIST  Final   Special Requests   Final    BOTTLES DRAWN AEROBIC ONLY Blood Culture results may not be optimal due to an inadequate volume of blood received in culture bottles   Culture  Setup Time   Final    GRAM NEGATIVE RODS AEROBIC BOTTLE ONLY Organism ID to follow CRITICAL RESULT CALLED TO, READ BACK BY AND VERIFIED WITHKelby Fam: F WILSON PHARMD 1901 07/28/20 A BROWNING Performed at [facility], [address] [place], [place] [address]    Culture ESCHERICHIA COLI (A)  Final   Report Status 07/30/2020 FINAL  Final   Organism ID, Bacteria ESCHERICHIA COLI  Final      Susceptibility   Escherichia coli - MIC*    AMPICILLIN <=2 SENSITIVE Sensitive     CEFAZOLIN <=4 SENSITIVE Sensitive     CEFEPIME <=0.12 SENSITIVE Sensitive     CEFTAZIDIME <=1 SENSITIVE Sensitive     CEFTRIAXONE <=0.25 SENSITIVE Sensitive     CIPROFLOXACIN <=0.25 SENSITIVE Sensitive     GENTAMICIN <=1 SENSITIVE Sensitive     IMIPENEM <=0.25 SENSITIVE Sensitive     TRIMETH/SULFA <=20 SENSITIVE Sensitive     AMPICILLIN/SULBACTAM <=2 SENSITIVE Sensitive     PIP/TAZO <=4 SENSITIVE Sensitive     * ESCHERICHIA COLI  Blood Culture ID Panel (Reflexed)     Status: Abnormal   Collection Time: 07/19/2020 11:29 PM  Result Value Ref Range Status   Enterococcus faecalis NOT DETECTED NOT DETECTED Final   Enterococcus Faecium NOT DETECTED NOT DETECTED Final   Listeria monocytogenes NOT DETECTED NOT DETECTED Final   Staphylococcus species NOT DETECTED NOT DETECTED Final   Staphylococcus aureus (BCID) NOT DETECTED NOT DETECTED Final   Staphylococcus epidermidis NOT DETECTED NOT DETECTED Final    Staphylococcus lugdunensis NOT DETECTED NOT DETECTED Final   Streptococcus species NOT DETECTED NOT DETECTED Final   Streptococcus agalactiae NOT DETECTED NOT DETECTED Final   Streptococcus pneumoniae NOT DETECTED NOT DETECTED Final   Streptococcus pyogenes NOT DETECTED NOT DETECTED Final   A.calcoaceticus-baumannii NOT DETECTED NOT DETECTED Final   Bacteroides fragilis NOT DETECTED NOT DETECTED Final   Enterobacterales DETECTED (A) NOT DETECTED Final    Comment: Enterobacterales represent a large order of gram negative bacteria, not a single organism.   Enterobacter cloacae complex NOT DETECTED NOT DETECTED Final   Escherichia coli DETECTED (A) NOT DETECTED Final    Comment: CRITICAL RESULT CALLED TO, READ BACK BY AND VERIFIED WITH: Kelby FamF WILSON PHARMD 1901 07/28/20 A BROWNING    Klebsiella aerogenes NOT DETECTED NOT DETECTED Final   Klebsiella oxytoca NOT DETECTED NOT DETECTED Final   Klebsiella pneumoniae NOT DETECTED NOT DETECTED Final   Proteus species NOT DETECTED NOT DETECTED Final   Salmonella species NOT DETECTED NOT DETECTED Final   Serratia marcescens NOT DETECTED NOT DETECTED Final   Haemophilus influenzae NOT DETECTED NOT DETECTED Final   Neisseria meningitidis NOT DETECTED NOT DETECTED Final   Pseudomonas aeruginosa NOT DETECTED NOT DETECTED Final   Stenotrophomonas maltophilia NOT DETECTED NOT DETECTED Final   Candida albicans NOT DETECTED NOT DETECTED Final   Candida auris NOT DETECTED NOT DETECTED Final   Candida glabrata NOT DETECTED NOT DETECTED Final   Candida krusei NOT DETECTED NOT DETECTED Final   Candida parapsilosis NOT DETECTED NOT DETECTED Final   Candida tropicalis NOT DETECTED NOT DETECTED Final   Cryptococcus neoformans/gattii NOT DETECTED NOT DETECTED Final   CTX-M ESBL NOT DETECTED NOT DETECTED  Final   Carbapenem resistance IMP NOT DETECTED NOT DETECTED Final   Carbapenem resistance KPC NOT DETECTED NOT DETECTED Final   Carbapenem resistance NDM NOT  DETECTED NOT DETECTED Final   Carbapenem resist OXA 48 LIKE NOT DETECTED NOT DETECTED Final   Carbapenem resistance VIM NOT DETECTED NOT DETECTED Final    Comment: Performed at [facility], [address] [place], [place] [address]  Urine culture     Status: Abnormal   Collection Time: 07/28/20  8:04 AM   Specimen: Urine, Catheterized  Result Value Ref Range Status   Specimen Description URINE, CATHETERIZED  Final   Special Requests NONE  Final   Culture (A)  Final    <10,000 COLONIES/mL INSIGNIFICANT GROWTH Performed at [facility], [address] [place], [place] [address]    Report Status 07/29/2020 FINAL  Final  MRSA PCR Screening     Status: None   Collection Time: 07/30/20  6:01 AM   Specimen: Nasal Mucosa; Nasopharyngeal  Result Value Ref Range Status   MRSA by PCR NEGATIVE NEGATIVE Final    Comment:        The GeneXpert MRSA Assay (FDA approved for NASAL specimens only), is one component of a comprehensive MRSA colonization surveillance program. It is not intended to diagnose MRSA infection nor to guide or monitor treatment for MRSA infections. Performed at [facility], [address], [place], [place] [address]          Radiology Studies: HYBRID OR IMAGING [facility] ONLY)  Result Date: 07/17/2020 There is no interpretation for this exam.  This order is for images obtained during a surgical procedure.  Please See "Surgeries" Tab for more information regarding the procedure.        Scheduled Meds: . albuterol  2 puff Inhalation Q6H  . amoxicillin-clavulanate  1 tablet Oral BID  . dexamethasone  6 mg Oral Daily  . feeding supplement (ENSURE ENLIVE)  237 mL Oral TID BM  . insulin aspart  0-15 Units Subcutaneous TID WC  . insulin glargine  5 Units Subcutaneous QHS  . multivitamin with minerals  1 tablet Oral Daily  . nitroGLYCERIN       Continuous Infusions: . heparin    . lactated ringers 125 mL/hr at 08/05/20 1212     LOS: 8 days     Time spent: 30 minutes    Dorcas Carrow, MD [facility] Pager [phone]

## 2020-08-05 NOTE — Anesthesia Postprocedure Evaluation (Signed)
Anesthesia Post Note  Patient: Todd Farrell  Procedure(s) Performed: CO 2 AORTOGRM BILATERAL LOWER EXTREMITY ANGIOGRAPHY WITH CO2 AND CONTRAST (Bilateral Groin) ULTRASOUND GUIDANCE FOR CANNULATON OF RIGHT COMMON FEMORAL ARTERY (Right Groin) BALLOON ANGIOPLASTY OF LEFT AT WITH 2.5 MM BALLOON MYNX DEVICE CLOSURE OF RIGHT COMMON FEMORAL ARTERY (Left Groin)     Patient location during evaluation: PACU Anesthesia Type: MAC Level of consciousness: awake and alert Pain management: pain level controlled Vital Signs Assessment: post-procedure vital signs reviewed and stable Respiratory status: spontaneous breathing, nonlabored ventilation, respiratory function stable and patient connected to nasal cannula oxygen Cardiovascular status: stable and blood pressure returned to baseline Postop Assessment: no apparent nausea or vomiting Anesthetic complications: no   No complications documented.  Last Vitals:  Vitals:   08/05/20 0410 08/05/20 0647  BP: 132/82 140/82  Pulse: 86 89  Resp: 20   Temp: 36.8 C (!) 36.3 C  SpO2: 94% 93%    Last Pain:  Vitals:   08/05/20 0410  TempSrc: Oral  PainSc:                  Catalina Gravel

## 2020-08-06 DIAGNOSIS — U071 COVID-19: Secondary | ICD-10-CM | POA: Diagnosis not present

## 2020-08-06 DIAGNOSIS — A4189 Other specified sepsis: Secondary | ICD-10-CM | POA: Diagnosis not present

## 2020-08-06 LAB — CBC WITH DIFFERENTIAL/PLATELET
Abs Immature Granulocytes: 0.32 10*3/uL — ABNORMAL HIGH (ref 0.00–0.07)
Basophils Absolute: 0.1 10*3/uL (ref 0.0–0.1)
Basophils Relative: 1 %
Eosinophils Absolute: 0 10*3/uL (ref 0.0–0.5)
Eosinophils Relative: 0 %
HCT: 38.2 % — ABNORMAL LOW (ref 39.0–52.0)
Hemoglobin: 12.3 g/dL — ABNORMAL LOW (ref 13.0–17.0)
Immature Granulocytes: 2 %
Lymphocytes Relative: 7 %
Lymphs Abs: 0.9 10*3/uL (ref 0.7–4.0)
MCH: 28.1 pg (ref 26.0–34.0)
MCHC: 32.2 g/dL (ref 30.0–36.0)
MCV: 87.4 fL (ref 80.0–100.0)
Monocytes Absolute: 0.8 10*3/uL (ref 0.1–1.0)
Monocytes Relative: 6 %
Neutro Abs: 11.1 10*3/uL — ABNORMAL HIGH (ref 1.7–7.7)
Neutrophils Relative %: 84 %
Platelets: 130 10*3/uL — ABNORMAL LOW (ref 150–400)
RBC: 4.37 MIL/uL (ref 4.22–5.81)
RDW: 14.2 % (ref 11.5–15.5)
WBC: 13.1 10*3/uL — ABNORMAL HIGH (ref 4.0–10.5)
nRBC: 0 % (ref 0.0–0.2)

## 2020-08-06 LAB — BASIC METABOLIC PANEL
Anion gap: 11 (ref 5–15)
BUN: 29 mg/dL — ABNORMAL HIGH (ref 8–23)
CO2: 20 mmol/L — ABNORMAL LOW (ref 22–32)
Calcium: 7.5 mg/dL — ABNORMAL LOW (ref 8.9–10.3)
Chloride: 108 mmol/L (ref 98–111)
Creatinine, Ser: 1.37 mg/dL — ABNORMAL HIGH (ref 0.61–1.24)
GFR calc Af Amer: >60 mL/min (ref >60)
GFR calc non Af Amer: 54 mL/min — ABNORMAL LOW (ref >60)
Glucose, Bld: 78 mg/dL (ref 70–99)
Potassium: 5.7 mmol/L — ABNORMAL HIGH (ref 3.5–5.1)
Sodium: 139 mmol/L (ref 135–145)

## 2020-08-06 LAB — GLUCOSE, CAPILLARY
Glucose-Capillary: 105 mg/dL — ABNORMAL HIGH (ref 70–99)
Glucose-Capillary: 104 mg/dL — ABNORMAL HIGH (ref 70–99)
Glucose-Capillary: 145 mg/dL — ABNORMAL HIGH (ref 70–99)
Glucose-Capillary: 131 mg/dL — ABNORMAL HIGH (ref 70–99)

## 2020-08-06 LAB — FERRITIN: Ferritin: 880 ng/mL — ABNORMAL HIGH (ref 24–336)

## 2020-08-06 LAB — HEPARIN LEVEL (UNFRACTIONATED)
Heparin Unfractionated: 1.28 IU/mL — ABNORMAL HIGH (ref 0.30–0.70)
Heparin Unfractionated: 0.46 IU/mL (ref 0.30–0.70)
Heparin Unfractionated: 0.67 IU/mL (ref 0.30–0.70)

## 2020-08-06 LAB — D-DIMER, QUANTITATIVE: D-Dimer, Quant: 0.96 ug/mL-FEU — ABNORMAL HIGH (ref 0.00–0.50)

## 2020-08-06 LAB — MAGNESIUM: Magnesium: 1.8 mg/dL (ref 1.7–2.4)

## 2020-08-06 LAB — C-REACTIVE PROTEIN: CRP: 2.5 mg/dL — ABNORMAL HIGH (ref <1.0)

## 2020-08-06 MED ORDER — HEPARIN (PORCINE) 25000 UT/250ML-% IV SOLN
500.0000 [IU]/h | INTRAVENOUS | Status: DC
  Filled 2020-08-06: qty 250

## 2020-08-06 NOTE — Progress Notes (Signed)
ANTICOAGULATION CONSULT NOTE - Follow Up Consult  Pharmacy Consult for Heparin Indication: DVT  No Known Allergies  Patient Measurements: Height: 5\' 7"  (170.2 cm) Weight: 43.1 kg (95 lb) IBW/kg (Calculated) : 66.1 Heparin Dosing Weight:  43.1 kg  Vital Signs: Temp: 98.2 F (36.8 C) (08/20 2007) Temp Source: Oral (08/20 2007) BP: 135/71 (08/20 2007) Pulse Rate: 77 (08/20 2007)  Labs: Recent Labs    08/10/20 0710 08/10/20 0710 August 10, 2020 2019 08/05/20 0014 08/05/20 0339 08/05/20 1116 08/06/20 0352  HGB 13.0   < >  --   --  12.1*  --  12.3*  HCT 39.6  --   --   --  38.2*  --  38.2*  PLT 215  --   --   --  184  --  130*  HEPARINUNFRC >2.20*   < > 1.58*  --   --  1.70* 1.28*  CREATININE  --   --   --   --  1.66*  --  1.37*  TROPONINIHS  --   --   --  13 14 19*  --    < > = values in this interval not displayed.    Estimated Creatinine Clearance: 33.2 mL/min (A) (by C-G formula based on SCr of 1.37 mg/dL (H)).  Assessment: 64 yr old male admitted with sepsis, COVID, found to have new DVT and started on IV heparin. Pt also has critical bilateral lower ischemia with wounds.  Heparin level remains elevated at 1.28. Plan for possible BKA and further intervention. We will hold and reduce rate.   Goal of Therapy:  Heparin level 0.3-0.7 units/ml Monitor platelets by anticoagulation protocol: Yes   Plan:  Hold heparin x 2 hrs Decrease rate to 500 units/hr Check 8 hr heparin level F/U transition to oral anticoagulant when able  64, PharmD, BCPS Please see amion for complete clinical pharmacist phone list 08/06/2020 4:53 AM

## 2020-08-06 NOTE — Progress Notes (Signed)
ANTICOAGULATION CONSULT NOTE - Follow Up Consult  Pharmacy Consult for heparin Indication: DVT   Labs: Recent Labs    07/21/2020 0710 08/03/2020 0710 08/09/2020 2019 08/05/20 0014 08/05/20 0339 08/05/20 1116 08/05/20 1116 08/06/20 0352 08/06/20 1510 08/06/20 2254  HGB 13.0   < >  --   --  12.1*  --   --  12.3*  --   --   HCT 39.6  --   --   --  38.2*  --   --  38.2*  --   --   PLT 215  --   --   --  184  --   --  130*  --   --   HEPARINUNFRC >2.20*  --    < >  --   --  1.70*   < > 1.28* 0.46 0.67  CREATININE  --   --   --   --  1.66*  --   --  1.37*  --   --   TROPONINIHS  --   --   --  13 14 19*  --   --   --   --    < > = values in this interval not displayed.    Assessment: 64yo male therapeutic on heparin but level is trending up and now at upper end of goal, concern that it will continue to increase; no gtt issues or signs of bleeding per RN.  Goal of Therapy:  Heparin level 0.3-0.7 units/ml   Plan:  Will decrease heparin gtt slightly to 450 units/hr and check level in 8 hours.    Vernard Gambles, PharmD, BCPS  08/06/2020,11:41 PM

## 2020-08-06 NOTE — Progress Notes (Signed)
PROGRESS NOTE    Todd Farrell  ZJQ:[id number] DOB: [birth year] DOA: 07/22/2020 PCP: Patient, No Pcp Per    Brief Narrative:  64 year old male with history of type 2 diabetes on insulin, hyperlipidemia, vitamin D deficiency, BPH, chronic kidney disease stage IIIb who presented to the emergency room with confusion and lethargic.  Patient was hypothermic and hypoxic.  In the emergency room, he was tachycardic and leukocytosis with severe lactic acidosis and evidence of DKA.  Treated with IV fluids and insulin drip, broad-spectrum antibiotics. COVID-19 positive.  Blood cultures with E. coli.  Urine culture with insignificant growth.  Clinically improving. He was found with ischemic ulceration of the right toes and gangrene left toes.  Assessment & Plan:   Principal Problem:   Sepsis due to COVID-19 [facility]) Active Problems:   Diabetic ketoacidosis without coma associated with type 2 diabetes mellitus (HCC)   Lactic acidosis   Acute respiratory failure with hypoxia (HCC)   Acute renal failure superimposed on stage 3b chronic kidney disease (HCC)   Acute metabolic encephalopathy   Hyperkalemia, diminished renal excretion   Severe protein-calorie malnutrition (HCC)   Prolonged QT interval  Sepsis present on admission due to multiple sources of infection: Sepsis physiology improving.  Continue to treat infection.  E. coli bacteremia due to UTI: Treated with IV fluids.  Treated with ceftriaxone then changed to IV cefazolin now changed to Augmentin.  Will treat with total 10 days of therapy.  COVID-19 viral infection with hypoxia: Pulmonary symptoms improved.  He is on room air now.  Continue chest physiotherapy. Discontinue steroids.  Increase mobility.  Peripheral vascular disease with arterial ulcers: Significant ulcerations of the toes, ABI with bilateral obstruction.  Patient has ischemic ulcer on the right great toe and middle toe Patient has dry gangrene on the left great toe and second  toe. Status post angiogram and intervention on the left anterior tibial artery Planned for angiogram and intervention on the right leg next week. At risk of losing both legs. Remains on heparin until surgical procedures.  Acute DVT , right posterior tibial acute DVT: Present on admission.  Currently on heparin.  Will change to Eliquis after procedures.  Hyperkalemia: Multifactorial.  Low potassium diet.  Discontinue Ringer lactate.  COVID-19 Labs  Recent Labs    07/21/2020 0710 08/05/20 0339 08/06/20 0352 08/06/20 1006  DDIMER 1.61* 1.20* 0.96*  --   FERRITIN 1,339* 39  --  880*  CRP 5.1* 3.8*  --  2.5*    Lab Results  Component Value Date   SARSCOV2NAA POSITIVE (A) 08/01/2020    SpO2: 92 % O2 Flow Rate (L/min): 2 L/min   Diabetic ketoacidosis: Uncontrolled type 2 diabetes.  Hemoglobin A1c 14.  Unsure about taking any medicine before admission.  Currently started on insulin regimen.  We will continue to monitor and adjust doses. Patient will need long-acting insulin, will start with Lantus 5 units at night.  Acute metabolic encephalopathy: Improved.  Nutrition Status: Nutrition Problem: Increased nutrient needs Etiology: catabolic illness (pneumonia secondary COVID-19 virus infection) Signs/Symptoms: estimated needs Interventions: MVI, Hormel Shake, Nepro shake  Acute kidney injury on CKD stage IIIb: Improved with IV fluids.  Presented with creatinine of more than 4, improved with IV fluid resuscitation.  No evidence of obstruction.  Profound physical debility: Work with PT OT.  Recommended rehab.  DVT prophylaxis: Heparin infusion   Code Status: Full code Family Communication: Daughter Leavy Cella on the phone 8/20. Disposition Plan: Status is: Inpatient  Remains inpatient appropriate because:IV treatments  appropriate due to intensity of illness or inability to take PO   Dispo: The patient is from: Home              Anticipated d/c is to: SNF versus home with home  health.              Anticipated d/c date is: 3 days, after the surgical procedure.              Patient currently is not medically stable to d/c.         Consultants:   Vascular surgery  Procedures:   None  Antimicrobials:  Antibiotics Given (last 72 hours)    Date/Time Action Medication Dose   08/03/20 2109 Given   amoxicillin-clavulanate (AUGMENTIN) 500-125 MG per tablet 500 mg 500 mg   07/21/2020 0956 Given   amoxicillin-clavulanate (AUGMENTIN) 500-125 MG per tablet 500 mg 500 mg   08/05/20 1211 Given   amoxicillin-clavulanate (AUGMENTIN) 500-125 MG per tablet 500 mg 500 mg   08/05/20 1955 Given   amoxicillin-clavulanate (AUGMENTIN) 500-125 MG per tablet 500 mg 500 mg   08/06/20 1013 Given   amoxicillin-clavulanate (AUGMENTIN) 500-125 MG per tablet 500 mg 500 mg         Subjective: Seen and examined.  Today denies any complaints.  Objective: Vitals:   08/05/20 0803 08/05/20 1322 08/05/20 2007 08/06/20 0525  BP: 121/79 125/80 135/71 (!) 149/76  Pulse: 93 86 77 66  Resp: 18 17 18 18   Temp: 97.8 F (36.6 C) 97.8 F (36.6 C) 98.2 F (36.8 C) 98.1 F (36.7 C)  TempSrc: Oral Oral Oral Oral  SpO2: 98% 91% 96% 92%  Weight:      Height:        Intake/Output Summary (Last 24 hours) at 08/06/2020 1346 Last data filed at 08/06/2020 08/08/2020 Gross per 24 hour  Intake 1610.9 ml  Output 1750 ml  Net -139.1 ml   Filed Weights   07/28/20 0012  Weight: 43.1 kg    Examination:  General exam: Appears calm and comfortable  Very frail looking gentleman, not in any distress. Respiratory system: Clear to auscultation. Respiratory effort normal.  Some conducted airway sounds. Cardiovascular system: S1 & S2 heard, RRR.  Gastrointestinal system: Abdomen is nondistended, soft and nontender. No organomegaly or masses felt. Normal bowel sounds heard. Central nervous system: Alert and oriented. No focal neurological deficits.  Generalized weakness. Extremities: Symmetric 5 x  5 power. Skin: No pedal pulse present.   Right toe with ulceration at the tip Left toes with dry gangrene of the tips.    Data Reviewed: I have personally reviewed following labs and imaging studies  CBC: Recent Labs  Lab 08/02/20 0500 08/03/20 0437 07/20/2020 0710 08/05/20 0339 08/06/20 0352  WBC 11.6* 11.9* 11.2* 16.9* 13.1*  NEUTROABS 9.5* 10.1* 9.6* 14.9* 11.1*  HGB 13.7 13.7 13.0 12.1* 12.3*  HCT 41.3 41.6 39.6 38.2* 38.2*  MCV 87.5 87.0 87.2 88.4 87.4  PLT 217 212 215 184 130*   Basic Metabolic Panel: Recent Labs  Lab 08/01/20 0152 08/01/20 0152 08/02/20 0500 08/03/20 0437 08/14/2020 0710 08/05/20 0339 08/06/20 0352  NA 143  --  142 141  --  133* 139  K 3.4*  --  3.9 4.2  --  5.2* 5.7*  CL 108  --  108 106  --  108 108  CO2 23  --  22 22  --  14* 20*  GLUCOSE 261*  --  265* 163*  --  282*  78  BUN 45*  --  39* 32*  --  31* 29*  CREATININE 1.57*  --  1.58* 1.51*  --  1.66* 1.37*  CALCIUM 7.0*  --  7.4* 7.5*  --  7.9* 7.5*  MG 1.7   < > 1.4* 1.4* 1.4* 1.9 1.8  PHOS  --   --   --   --  2.8  --   --    < > = values in this interval not displayed.   GFR: Estimated Creatinine Clearance: 33.2 mL/min (A) (by C-G formula based on SCr of 1.37 mg/dL (H)). Liver Function Tests: Recent Labs  Lab 07/31/20 0635 08/01/20 0152 08/02/20 0500 08/03/20 0437  AST 35 23 27 33  ALT ALKPHOS 73 68 81 82  BILITOT 0.7 0.8 0.7 1.1  PROT 6.0* 4.8* 5.2* 5.3*  ALBUMIN 1.8* 1.4* 1.6* 1.5*   No results for input(s): LIPASE, AMYLASE in the last 168 hours. No results for input(s): AMMONIA in the last 168 hours. Coagulation Profile: No results for input(s): INR, PROTIME in the last 168 hours. Cardiac Enzymes: No results for input(s): CKTOTAL, CKMB, CKMBINDEX, TROPONINI in the last 168 hours. BNP (last 3 results) No results for input(s): PROBNP in the last 8760 hours. HbA1C: No results for input(s): HGBA1C in the last 72 hours. CBG: Recent Labs  Lab 08/05/20 1202  08/05/20 1603 08/05/20 2051 08/06/20 0752 08/06/20 1148  GLUCAP 119* 116* 152* 105* 104*   Lipid Profile: No results for input(s): CHOL, HDL, LDLCALC, TRIG, CHOLHDL, LDLDIRECT in the last 72 hours. Thyroid Function Tests: No results for input(s): TSH, T4TOTAL, FREET4, T3FREE, THYROIDAB in the last 72 hours. Anemia Panel: Recent Labs    08/05/20 0339 08/06/20 1006  FERRITIN 39 880*   Sepsis Labs: No results for input(s): PROCALCITON, LATICACIDVEN in the last 168 hours.  Recent Results (from the past 240 hour(s))  SARS Coronavirus 2 by RT PCR (hospital order, performed in [facility] hospital lab) Nasopharyngeal Nasopharyngeal Swab     Status: Abnormal   Collection Time: 08/02/2020 11:17 PM   Specimen: Nasopharyngeal Swab  Result Value Ref Range Status   SARS Coronavirus 2 POSITIVE (A) NEGATIVE Final    Comment: RESULT CALLED TO, READ BACK BY AND VERIFIED WITH: Laurelyn Sickle RN 07/28/20 0221 JDW (NOTE) SARS-CoV-2 target nucleic acids are DETECTED  SARS-CoV-2 RNA is generally detectable in upper respiratory specimens  during the acute phase of infection.  Positive results are indicative  of the presence of the identified virus, but do not rule out bacterial infection or co-infection with other pathogens not detected by the test.  Clinical correlation with patient history and  other diagnostic information is necessary to determine patient infection status.  The expected result is negative.  Fact Sheet for Patients:   BoilerBrush.com.cy   Fact Sheet for Healthcare Providers:   https://pope.com/    This test is not yet approved or cleared by the [place] FDA and  has been authorized for detection and/or diagnosis of SARS-CoV-2 by FDA under an Emergency Use Authorization (EUA).  This EUA will remain in effect (meaning this test c an be used) for the duration of  the COVID-19 declaration under Section 564(b)(1) of the Act,  21 U.S.C. section 360-bbb-3(b)(1), unless the authorization is terminated or revoked sooner.  Performed at [facility], [address], [place], [place] [address]   Culture, blood (x 2)     Status: Abnormal   Collection Time: 08/14/2020  11:29 PM   Specimen: BLOOD LEFT WRIST  Result Value Ref Range Status   Specimen Description BLOOD LEFT WRIST  Final   Special Requests   Final    BOTTLES DRAWN AEROBIC ONLY Blood Culture results may not be optimal due to an inadequate volume of blood received in culture bottles   Culture  Setup Time   Final    GRAM NEGATIVE RODS AEROBIC BOTTLE ONLY Organism ID to follow CRITICAL RESULT CALLED TO, READ BACK BY AND VERIFIED WITHKelby Fam: F WILSON PHARMD 1901 07/28/20 A BROWNING Performed at [facility], [address] [place], [place] [address]    Culture ESCHERICHIA COLI (A)  Final   Report Status 07/30/2020 FINAL  Final   Organism ID, Bacteria ESCHERICHIA COLI  Final      Susceptibility   Escherichia coli - MIC*    AMPICILLIN <=2 SENSITIVE Sensitive     CEFAZOLIN <=4 SENSITIVE Sensitive     CEFEPIME <=0.12 SENSITIVE Sensitive     CEFTAZIDIME <=1 SENSITIVE Sensitive     CEFTRIAXONE <=0.25 SENSITIVE Sensitive     CIPROFLOXACIN <=0.25 SENSITIVE Sensitive     GENTAMICIN <=1 SENSITIVE Sensitive     IMIPENEM <=0.25 SENSITIVE Sensitive     TRIMETH/SULFA <=20 SENSITIVE Sensitive     AMPICILLIN/SULBACTAM <=2 SENSITIVE Sensitive     PIP/TAZO <=4 SENSITIVE Sensitive     * ESCHERICHIA COLI  Blood Culture ID Panel (Reflexed)     Status: Abnormal   Collection Time: 08/05/2020 11:29 PM  Result Value Ref Range Status   Enterococcus faecalis NOT DETECTED NOT DETECTED Final   Enterococcus Faecium NOT DETECTED NOT DETECTED Final   Listeria monocytogenes NOT DETECTED NOT DETECTED Final   Staphylococcus species NOT DETECTED NOT DETECTED Final   Staphylococcus aureus (BCID) NOT DETECTED NOT DETECTED Final   Staphylococcus epidermidis NOT DETECTED NOT  DETECTED Final   Staphylococcus lugdunensis NOT DETECTED NOT DETECTED Final   Streptococcus species NOT DETECTED NOT DETECTED Final   Streptococcus agalactiae NOT DETECTED NOT DETECTED Final   Streptococcus pneumoniae NOT DETECTED NOT DETECTED Final   Streptococcus pyogenes NOT DETECTED NOT DETECTED Final   A.calcoaceticus-baumannii NOT DETECTED NOT DETECTED Final   Bacteroides fragilis NOT DETECTED NOT DETECTED Final   Enterobacterales DETECTED (A) NOT DETECTED Final    Comment: Enterobacterales represent a large order of gram negative bacteria, not a single organism.   Enterobacter cloacae complex NOT DETECTED NOT DETECTED Final   Escherichia coli DETECTED (A) NOT DETECTED Final    Comment: CRITICAL RESULT CALLED TO, READ BACK BY AND VERIFIED WITH: Kelby FamF WILSON PHARMD 1901 07/28/20 A BROWNING    Klebsiella aerogenes NOT DETECTED NOT DETECTED Final   Klebsiella oxytoca NOT DETECTED NOT DETECTED Final   Klebsiella pneumoniae NOT DETECTED NOT DETECTED Final   Proteus species NOT DETECTED NOT DETECTED Final   Salmonella species NOT DETECTED NOT DETECTED Final   Serratia marcescens NOT DETECTED NOT DETECTED Final   Haemophilus influenzae NOT DETECTED NOT DETECTED Final   Neisseria meningitidis NOT DETECTED NOT DETECTED Final   Pseudomonas aeruginosa NOT DETECTED NOT DETECTED Final   Stenotrophomonas maltophilia NOT DETECTED NOT DETECTED Final   Candida albicans NOT DETECTED NOT DETECTED Final   Candida auris NOT DETECTED NOT DETECTED Final   Candida glabrata NOT DETECTED NOT DETECTED Final   Candida krusei NOT DETECTED NOT DETECTED Final   Candida parapsilosis NOT DETECTED NOT DETECTED Final   Candida tropicalis NOT DETECTED NOT DETECTED Final   Cryptococcus neoformans/gattii NOT DETECTED NOT DETECTED  Final   CTX-M ESBL NOT DETECTED NOT DETECTED Final   Carbapenem resistance IMP NOT DETECTED NOT DETECTED Final   Carbapenem resistance KPC NOT DETECTED NOT DETECTED Final   Carbapenem  resistance NDM NOT DETECTED NOT DETECTED Final   Carbapenem resist OXA 48 LIKE NOT DETECTED NOT DETECTED Final   Carbapenem resistance VIM NOT DETECTED NOT DETECTED Final    Comment: Performed at [facility], [address], [place], [place] [address]  Urine culture     Status: Abnormal   Collection Time: 07/28/20  8:04 AM   Specimen: Urine, Catheterized  Result Value Ref Range Status   Specimen Description URINE, CATHETERIZED  Final   Special Requests NONE  Final   Culture (A)  Final    <10,000 COLONIES/mL INSIGNIFICANT GROWTH Performed at [facility], [address] [place], [place] [address]    Report Status 07/29/2020 FINAL  Final  MRSA PCR Screening     Status: None   Collection Time: 07/30/20  6:01 AM   Specimen: Nasal Mucosa; Nasopharyngeal  Result Value Ref Range Status   MRSA by PCR NEGATIVE NEGATIVE Final    Comment:        The GeneXpert MRSA Assay (FDA approved for NASAL specimens only), is one component of a comprehensive MRSA colonization surveillance program. It is not intended to diagnose MRSA infection nor to guide or monitor treatment for MRSA infections. Performed at [facility], [address] [place], [place] [address]          Radiology Studies: No results found.      Scheduled Meds: . albuterol  2 puff Inhalation Q6H  . amoxicillin-clavulanate  1 tablet Oral BID  . feeding supplement (ENSURE ENLIVE)  237 mL Oral TID BM  . insulin aspart  0-15 Units Subcutaneous TID WC  . insulin glargine  5 Units Subcutaneous QHS   Continuous Infusions: . heparin 500 Units/hr (08/06/20 0655)     LOS: 9 days    Time spent: 30 minutes    Dorcas Carrow, MD [facility] Pager [phone]

## 2020-08-06 NOTE — Progress Notes (Signed)
ANTICOAGULATION CONSULT NOTE - Follow Up Consult  Pharmacy Consult for Heparin Indication: DVT  No Known Allergies  Patient Measurements: Height: 5\' 7"  (170.2 cm) Weight: 43.1 kg (95 lb) IBW/kg (Calculated) : 66.1 Heparin Dosing Weight:  43.1 kg  Vital Signs: Temp: 98.5 F (36.9 C) (08/21 1507) Temp Source: Oral (08/21 1507) BP: 145/84 (08/21 1507) Pulse Rate: 98 (08/21 1507)  Labs: Recent Labs    08/05/20 0710 2020-08-05 0710 August 05, 2020 2019 08/05/20 0014 08/05/20 0339 08/05/20 1116 08/06/20 0352 08/06/20 1510  HGB 13.0   < >  --   --  12.1*  --  12.3*  --   HCT 39.6  --   --   --  38.2*  --  38.2*  --   PLT 215  --   --   --  184  --  130*  --   HEPARINUNFRC >2.20*  --    < >  --   --  1.70* 1.28* 0.46  CREATININE  --   --   --   --  1.66*  --  1.37*  --   TROPONINIHS  --   --   --  13 14 19*  --   --    < > = values in this interval not displayed.    Estimated Creatinine Clearance: 33.2 mL/min (A) (by C-G formula based on SCr of 1.37 mg/dL (H)).  Assessment: 64 yr old male admitted with sepsis, COVID, found to have new DVT and started on IV heparin. Pt also has critical bilateral lower ischemia with wounds.  Heparin level at goal this afternoon. Plan for possible BKA and further intervention. No bleeding issues noted.   Goal of Therapy:  Heparin level 0.3-0.7 units/ml Monitor platelets by anticoagulation protocol: Yes   Plan:  Continue heparin at 500 units/hr Check 8 hr heparin level to confirm F/U transition to oral anticoagulant when able  64 PharmD., BCPS Clinical Pharmacist 08/06/2020 4:03 PM

## 2020-08-07 DIAGNOSIS — A4189 Other specified sepsis: Secondary | ICD-10-CM | POA: Diagnosis not present

## 2020-08-07 DIAGNOSIS — U071 COVID-19: Secondary | ICD-10-CM | POA: Diagnosis not present

## 2020-08-07 LAB — CBC
HCT: 28.6 % — ABNORMAL LOW (ref 39.0–52.0)
Hemoglobin: 9.2 g/dL — ABNORMAL LOW (ref 13.0–17.0)
MCH: 28.3 pg (ref 26.0–34.0)
MCHC: 32.2 g/dL (ref 30.0–36.0)
MCV: 88 fL (ref 80.0–100.0)
Platelets: 143 10*3/uL — ABNORMAL LOW (ref 150–400)
RBC: 3.25 MIL/uL — ABNORMAL LOW (ref 4.22–5.81)
RDW: 13.8 % (ref 11.5–15.5)
WBC: 11.9 10*3/uL — ABNORMAL HIGH (ref 4.0–10.5)
nRBC: 0.2 % (ref 0.0–0.2)

## 2020-08-07 LAB — BASIC METABOLIC PANEL
Anion gap: 10 (ref 5–15)
BUN: 28 mg/dL — ABNORMAL HIGH (ref 8–23)
CO2: 23 mmol/L (ref 22–32)
Calcium: 7.6 mg/dL — ABNORMAL LOW (ref 8.9–10.3)
Chloride: 105 mmol/L (ref 98–111)
Creatinine, Ser: 1.6 mg/dL — ABNORMAL HIGH (ref 0.61–1.24)
GFR calc Af Amer: 52 mL/min — ABNORMAL LOW (ref >60)
GFR calc non Af Amer: 45 mL/min — ABNORMAL LOW (ref >60)
Glucose, Bld: 183 mg/dL — ABNORMAL HIGH (ref 70–99)
Potassium: 3.6 mmol/L (ref 3.5–5.1)
Sodium: 138 mmol/L (ref 135–145)

## 2020-08-07 LAB — GLUCOSE, CAPILLARY
Glucose-Capillary: 175 mg/dL — ABNORMAL HIGH (ref 70–99)
Glucose-Capillary: 144 mg/dL — ABNORMAL HIGH (ref 70–99)
Glucose-Capillary: 121 mg/dL — ABNORMAL HIGH (ref 70–99)
Glucose-Capillary: 77 mg/dL (ref 70–99)

## 2020-08-07 LAB — HEPARIN LEVEL (UNFRACTIONATED): Heparin Unfractionated: 0.41 IU/mL (ref 0.30–0.70)

## 2020-08-07 NOTE — Progress Notes (Signed)
PROGRESS NOTE    Todd Farrell  OVF:[id number] DOB: 06-Sep-1956 DOA: 08/07/2020 PCP: Patient, No Pcp Per    Brief Narrative:  64 year old male with history of type 2 diabetes on insulin, hyperlipidemia, vitamin D deficiency, BPH, chronic kidney disease stage IIIb who presented to the emergency room with confusion and lethargic.  Patient was hypothermic and hypoxic.  In the emergency room, he was tachycardic and leukocytosis with severe lactic acidosis and evidence of DKA.  Treated with IV fluids and insulin drip, broad-spectrum antibiotics. COVID-19 positive.  Blood cultures with E. coli.  Urine culture with insignificant growth.  Clinically improving. He was found with ischemic ulceration of the right toes and gangrene left toes.  Assessment & Plan:   Principal Problem:   Sepsis due to COVID-19 [facility]) Active Problems:   Diabetic ketoacidosis without coma associated with type 2 diabetes mellitus (HCC)   Lactic acidosis   Acute respiratory failure with hypoxia (HCC)   Acute renal failure superimposed on stage 3b chronic kidney disease (HCC)   Acute metabolic encephalopathy   Hyperkalemia, diminished renal excretion   Severe protein-calorie malnutrition (HCC)   Prolonged QT interval  Sepsis present on admission due to multiple sources of infection: Sepsis physiology improving.  Continue to treat infection.  E. coli bacteremia due to UTI: Treated with IV fluids.  Treated with ceftriaxone then changed to IV cefazolin now changed to Augmentin.   Finished antibiotic therapy.  COVID-19 viral infection with hypoxia: Pulmonary symptoms improved.  He is on room air now.  Continue chest physiotherapy. Increase mobility. Finished therapies.  Peripheral vascular disease with arterial ulcers: Significant ulcerations of the toes, ABI with bilateral obstruction.  Patient has ischemic ulcer on the right great toe and middle toe Patient has dry gangrene on the left great toe and second toe. Status  post angiogram and intervention on the left anterior tibial artery Planned for angiogram and intervention on the right leg next week. At risk of losing both legs. Remains on heparin until surgical procedures.  Acute DVT , right posterior tibial acute DVT: Present on admission.  Currently on heparin.  Will change to Eliquis after procedures.  Hyperkalemia: Multifactorial. Improved.  COVID-19 Labs  Recent Labs    08/05/20 0339 08/06/20 0352 08/06/20 1006  DDIMER 1.20* 0.96*  --   FERRITIN 39  --  880*  CRP 3.8*  --  2.5*    Lab Results  Component Value Date   SARSCOV2NAA POSITIVE (A) 08/05/2020    SpO2: 92 % O2 Flow Rate (L/min): 2 L/min   Diabetic ketoacidosis: Uncontrolled type 2 diabetes.  Hemoglobin A1c 14.  Unsure about taking any medicine before admission.  Currently started on insulin regimen.  We will continue to monitor and adjust doses. Patient will need long-acting insulin,stable on current doses.  Acute metabolic encephalopathy: Improved.  Nutrition Status: Nutrition Problem: Increased nutrient needs Etiology: catabolic illness (pneumonia secondary COVID-19 virus infection) Signs/Symptoms: estimated needs Interventions: MVI, Hormel Shake, Nepro shake  Acute kidney injury on CKD stage IIIb: Improved with IV fluids.  Presented with creatinine of more than 4, improved with IV fluid resuscitation.  No evidence of obstruction.  Profound physical debility: Work with PT OT.  Recommended rehab.  DVT prophylaxis: Heparin infusion   Code Status: Full code Family Communication: Daughter Leavy Cella on the phone 8/20. Disposition Plan: Status is: Inpatient  Remains inpatient appropriate because:IV treatments appropriate due to intensity of illness or inability to take PO   Dispo: The patient is from: Home  Anticipated d/c is to: SNF versus home with home health.              Anticipated d/c date is: 3 days, after the surgical procedure.               Patient currently is not medically stable to d/c.   Waiting for vascular procedure to discharge.      Consultants:   Vascular surgery  Procedures:   None  Antimicrobials:  Antibiotics Given (last 72 hours)    Date/Time Action Medication Dose   08/05/20 1211 Given   amoxicillin-clavulanate (AUGMENTIN) 500-125 MG per tablet 500 mg 500 mg   08/05/20 1955 Given   amoxicillin-clavulanate (AUGMENTIN) 500-125 MG per tablet 500 mg 500 mg   08/06/20 1013 Given   amoxicillin-clavulanate (AUGMENTIN) 500-125 MG per tablet 500 mg 500 mg   08/06/20 2124 Given   amoxicillin-clavulanate (AUGMENTIN) 500-125 MG per tablet 500 mg 500 mg         Subjective: Seen and examined.  Today denies any complaints. On room air.  Objective: Vitals:   08/06/20 1507 08/06/20 2045 08/07/20 0547 08/07/20 0756  BP: (!) 145/84 125/68 135/88 (!) 150/88  Pulse: 98 97 93 91  Resp: 19 18 18 18   Temp: 98.5 F (36.9 C) 97.7 F (36.5 C) 98.6 F (37 C) 98.6 F (37 C)  TempSrc: Oral Oral Oral Oral  SpO2: 91% 91% 92% 92%  Weight:      Height:        Intake/Output Summary (Last 24 hours) at 08/07/2020 1139 Last data filed at 08/07/2020 0650 Gross per 24 hour  Intake 116.03 ml  Output 450 ml  Net -333.97 ml   Filed Weights   07/28/20 0012  Weight: 43.1 kg    Examination:  General exam: Appears calm and comfortable , on room air. Respiratory system: Clear to auscultation. Respiratory effort normal.  Some conducted airway sounds. Cardiovascular system: S1 & S2 heard, RRR.  Gastrointestinal system: Abdomen is nondistended, soft and nontender. No organomegaly or masses felt. Normal bowel sounds heard. Central nervous system: Alert and oriented. No focal neurological deficits.  Generalized weakness. Extremities: Symmetric 5 x 5 power. Skin: No pedal pulse present.   Right toe with ulceration at the tip Left toes with dry gangrene of the tips.    Data Reviewed: I have personally reviewed  following labs and imaging studies  CBC: Recent Labs  Lab 08/02/20 0500 08/03/20 0437 07/19/2020 0710 08/05/20 0339 08/06/20 0352  WBC 11.6* 11.9* 11.2* 16.9* 13.1*  NEUTROABS 9.5* 10.1* 9.6* 14.9* 11.1*  HGB 13.7 13.7 13.0 12.1* 12.3*  HCT 41.3 41.6 39.6 38.2* 38.2*  MCV 87.5 87.0 87.2 88.4 87.4  PLT 217 212 215 184 130*   Basic Metabolic Panel: Recent Labs  Lab 08/02/20 0500 08/03/20 0437 08/08/2020 0710 08/05/20 0339 08/06/20 0352 08/07/20 0900  NA 142 141  --  133* 139 138  K 3.9 4.2  --  5.2* 5.7* 3.6  CL 108 106  --  108 108 105  CO2 22 22  --  14* 20* 23  GLUCOSE 265* 163*  --  282* 78 183*  BUN 39* 32*  --  31* 29* 28*  CREATININE 1.58* 1.51*  --  1.66* 1.37* 1.60*  CALCIUM 7.4* 7.5*  --  7.9* 7.5* 7.6*  MG 1.4* 1.4* 1.4* 1.9 1.8  --   PHOS  --   --  2.8  --   --   --    GFR:  Estimated Creatinine Clearance: 28.4 mL/min (A) (by C-G formula based on SCr of 1.6 mg/dL (H)). Liver Function Tests: Recent Labs  Lab 08/01/20 0152 08/02/20 0500 08/03/20 0437  AST 23 27 33  ALT 15 15 17   ALKPHOS 68 81 82  BILITOT 0.8 0.7 1.1  PROT 4.8* 5.2* 5.3*  ALBUMIN 1.4* 1.6* 1.5*   No results for input(s): LIPASE, AMYLASE in the last 168 hours. No results for input(s): AMMONIA in the last 168 hours. Coagulation Profile: No results for input(s): INR, PROTIME in the last 168 hours. Cardiac Enzymes: No results for input(s): CKTOTAL, CKMB, CKMBINDEX, TROPONINI in the last 168 hours. BNP (last 3 results) No results for input(s): PROBNP in the last 8760 hours. HbA1C: No results for input(s): HGBA1C in the last 72 hours. CBG: Recent Labs  Lab 08/06/20 0752 08/06/20 1148 08/06/20 1706 08/06/20 2118 08/07/20 0757  GLUCAP 105* 104* 145* 131* 175*   Lipid Profile: No results for input(s): CHOL, HDL, LDLCALC, TRIG, CHOLHDL, LDLDIRECT in the last 72 hours. Thyroid Function Tests: No results for input(s): TSH, T4TOTAL, FREET4, T3FREE, THYROIDAB in the last 72  hours. Anemia Panel: Recent Labs    08/05/20 0339 08/06/20 1006  FERRITIN 39 880*   Sepsis Labs: No results for input(s): PROCALCITON, LATICACIDVEN in the last 168 hours.  Recent Results (from the past 240 hour(s))  MRSA PCR Screening     Status: None   Collection Time: 07/30/20  6:01 AM   Specimen: Nasal Mucosa; Nasopharyngeal  Result Value Ref Range Status   MRSA by PCR NEGATIVE NEGATIVE Final    Comment:        The GeneXpert MRSA Assay (FDA approved for NASAL specimens only), is one component of a comprehensive MRSA colonization surveillance program. It is not intended to diagnose MRSA infection nor to guide or monitor treatment for MRSA infections. Performed at [facility], [address] [place], [place]          Radiology Studies: No results found.      Scheduled Meds: . albuterol  2 puff Inhalation Q6H  . feeding supplement (ENSURE ENLIVE)  237 mL Oral TID BM  . insulin aspart  0-15 Units Subcutaneous TID WC  . insulin glargine  5 Units Subcutaneous QHS   Continuous Infusions: . heparin 450 Units/hr (08/06/20 2343)     LOS: 10 days    Time spent: 30 minutes    08/08/20, MD [facility] Pager [phone]

## 2020-08-07 NOTE — Progress Notes (Signed)
ANTICOAGULATION CONSULT NOTE - Follow Up Consult  Pharmacy Consult for Heparin Indication: DVT  No Known Allergies  Patient Measurements: Height: 5\' 7"  (170.2 cm) Weight: 43.1 kg (95 lb) IBW/kg (Calculated) : 66.1 Heparin Dosing Weight:  43.1 kg  Vital Signs: Temp: 98.6 F (37 C) (08/22 0756) Temp Source: Oral (08/22 0756) BP: 150/88 (08/22 0756) Pulse Rate: 91 (08/22 0756)  Labs: Recent Labs    08/02/2020 2019 08/05/20 0014 08/05/20 0339 08/05/20 1116 08/05/20 1116 08/06/20 0352 08/06/20 0352 08/06/20 1510 08/06/20 2254 08/07/20 0900  HGB  --   --  12.1*  --   --  12.3*  --   --   --   --   HCT  --   --  38.2*  --   --  38.2*  --   --   --   --   PLT  --   --  184  --   --  130*  --   --   --   --   HEPARINUNFRC   < >  --   --  1.70*   < > 1.28*   < > 0.46 0.67 0.41  CREATININE  --   --  1.66*  --   --  1.37*  --   --   --   --   TROPONINIHS  --  13 14 19*  --   --   --   --   --   --    < > = values in this interval not displayed.    Estimated Creatinine Clearance: 33.2 mL/min (A) (by C-G formula based on SCr of 1.37 mg/dL (H)).  Assessment: 64 yr old male admitted with sepsis, COVID, found to have new DVT and started on IV heparin. Pt also has critical bilateral lower ischemia with wounds.  Heparin level was therapeutic yesterday but at upper end of goal, so was decreased 500 to 450 units/hr. 8-hour heparin level at goal this morning at 0.41.Has had 3 therapeutic HLs at this point, so will continue to follow levels daily. Plan for possible BKA and further intervention. No bleeding issues noted.   Goal of Therapy:  Heparin level 0.3-0.7 units/ml Monitor platelets by anticoagulation protocol: Yes   Plan:  Continue heparin at 450 units/hr Monitor daily HL and CBC F/U transition to oral anticoagulant when able (likely upcoming procedure this week)  64, PharmD PGY1 Acute Care Pharmacy Resident  08/07/2020 9:36 AM  Please check [email or web address] for  unit-specific pharmacy phone numbers.

## 2020-08-08 DIAGNOSIS — U071 COVID-19: Secondary | ICD-10-CM | POA: Diagnosis not present

## 2020-08-08 DIAGNOSIS — A4189 Other specified sepsis: Secondary | ICD-10-CM | POA: Diagnosis not present

## 2020-08-08 LAB — BASIC METABOLIC PANEL
Anion gap: 9 (ref 5–15)
BUN: 24 mg/dL — ABNORMAL HIGH (ref 8–23)
CO2: 21 mmol/L — ABNORMAL LOW (ref 22–32)
Calcium: 7.5 mg/dL — ABNORMAL LOW (ref 8.9–10.3)
Chloride: 109 mmol/L (ref 98–111)
Creatinine, Ser: 1.4 mg/dL — ABNORMAL HIGH (ref 0.61–1.24)
GFR calc Af Amer: >60 mL/min (ref >60)
GFR calc non Af Amer: 53 mL/min — ABNORMAL LOW (ref >60)
Glucose, Bld: 206 mg/dL — ABNORMAL HIGH (ref 70–99)
Potassium: 4 mmol/L (ref 3.5–5.1)
Sodium: 139 mmol/L (ref 135–145)

## 2020-08-08 LAB — GLUCOSE, CAPILLARY
Glucose-Capillary: 103 mg/dL — ABNORMAL HIGH (ref 70–99)
Glucose-Capillary: 124 mg/dL — ABNORMAL HIGH (ref 70–99)
Glucose-Capillary: 155 mg/dL — ABNORMAL HIGH (ref 70–99)
Glucose-Capillary: 166 mg/dL — ABNORMAL HIGH (ref 70–99)
Glucose-Capillary: 188 mg/dL — ABNORMAL HIGH (ref 70–99)

## 2020-08-08 LAB — HEPARIN LEVEL (UNFRACTIONATED)
Heparin Unfractionated: 0.23 IU/mL — ABNORMAL LOW (ref 0.30–0.70)
Heparin Unfractionated: 0.11 IU/mL — ABNORMAL LOW (ref 0.30–0.70)

## 2020-08-08 LAB — CBC
HCT: 25.4 % — ABNORMAL LOW (ref 39.0–52.0)
Hemoglobin: 8.2 g/dL — ABNORMAL LOW (ref 13.0–17.0)
MCH: 28.6 pg (ref 26.0–34.0)
MCHC: 32.3 g/dL (ref 30.0–36.0)
MCV: 88.5 fL (ref 80.0–100.0)
Platelets: 135 10*3/uL — ABNORMAL LOW (ref 150–400)
RBC: 2.87 MIL/uL — ABNORMAL LOW (ref 4.22–5.81)
RDW: 14 % (ref 11.5–15.5)
WBC: 12.4 10*3/uL — ABNORMAL HIGH (ref 4.0–10.5)
nRBC: 0.2 % (ref 0.0–0.2)

## 2020-08-08 LAB — HEMOGLOBIN AND HEMATOCRIT, BLOOD
HCT: 25.2 % — ABNORMAL LOW (ref 39.0–52.0)
Hemoglobin: 8 g/dL — ABNORMAL LOW (ref 13.0–17.0)

## 2020-08-08 MED ORDER — HEPARIN (PORCINE) 25000 UT/250ML-% IV SOLN
900.0000 [IU]/h | INTRAVENOUS | Status: DC
  Administered 2020-08-08: 500 [IU]/h via INTRAVENOUS
  Administered 2020-08-09: 700 [IU]/h via INTRAVENOUS
  Filled 2020-08-08 (×2): qty 250

## 2020-08-08 NOTE — Plan of Care (Signed)

## 2020-08-08 NOTE — Progress Notes (Signed)
  Speech Language Pathology Treatment: Dysphagia  Patient Details Name: Todd Farrell MRN: [id number] DOB: Oct 12, 1956 Today's Date: 08/08/2020 Time: 1856-3149 SLP Time Calculation (min) (ACUTE ONLY): 8 min  Assessment / Plan / Recommendation Clinical Impression  Plan has been to upgrade diet texture as able- pt was NPO end of last week due to procedure. Diet texture is now regular and he reports is able to chew much better now. Adequate timing and efficiency with solid and no s/s aspiration nor with straw sips thin and pt independent. Reviewed swallow strategies. Recommend continue thin and regular. ST will sign off.   HPI HPI: Patient is a 64 y.o. male with PMH: DM-2, HLD, vitamin D deficiency, benign prostate hyperplasia, chronic kidney disease stage IIIb who prestented to hospital via EMS secondary to family concerned he was becoming confused. He was hypothermic and hypoxic upon ED admission, with multiple SIRS criteria and was found to be Covid positive with left lower lobe infiltrate.      SLP Plan  All goals met;Discharge SLP treatment due to (comment)       Recommendations  Diet recommendations: Regular;Thin liquid Liquids provided via: Straw Medication Administration: Whole meds with liquid Supervision: Patient able to self feed Compensations: Slow rate;Small sips/bites Postural Changes and/or Swallow Maneuvers: Seated upright 90 degrees                Oral Care Recommendations: Oral care BID Follow up Recommendations: None SLP Visit Diagnosis: Dysphagia, unspecified (R13.10) Plan: All goals met;Discharge SLP treatment due to (comment)       GO                Houston Siren 08/08/2020, 2:44 PM

## 2020-08-08 NOTE — Progress Notes (Signed)
Patient bleeding though IVs after ordered increase on heparin drip. MD on call notified.   Heparin drip held. STAT h/h ordered. Will continue to monitor.

## 2020-08-08 NOTE — Progress Notes (Signed)
ANTICOAGULATION CONSULT NOTE - Follow Up Consult  Pharmacy Consult for Heparin Indication: 8/16 RLE DVT  No Known Allergies  Patient Measurements: Height: 5\' 7"  (170.2 cm) Weight: 43.1 kg (95 lb) IBW/kg (Calculated) : 66.1 Heparin Dosing Weight:  43.1 kg  Vital Signs: Temp: 98.3 F (36.8 C) (08/23 0546) Temp Source: Oral (08/23 0546) BP: 145/94 (08/23 0546) Pulse Rate: 92 (08/23 0546)  Labs: Recent Labs    08/06/20 0352 08/06/20 0352 08/06/20 1510 08/06/20 2254 08/07/20 0900 08/07/20 1250 08/07/20 1250 08/08/20 0355 08/08/20 0739  HGB 12.3*   < >  --   --   --  9.2*   < > 8.2* 8.0*  HCT 38.2*   < >  --   --   --  28.6*  --  25.4* 25.2*  PLT 130*  --   --   --   --  143*  --  135*  --   HEPARINUNFRC 1.28*  --    < > 0.67 0.41  --   --  0.23*  --   CREATININE 1.37*  --   --   --  1.60*  --   --   --  1.40*   < > = values in this interval not displayed.    Estimated Creatinine Clearance: 32.5 mL/min (A) (by C-G formula based on SCr of 1.4 mg/dL (H)).  Assessment: 64 yr old male admitted with sepsis, COVID, found to have new DVT and started on IV heparin. Pt also has critical bilateral lower ischemia with wounds.   8/23 AM patient with bleeding from PIV sites, heparin was actually subtherapeutic. Heparin held from 6:50am to 13:00pm. Per discussion with MD, ok to restart heparin given acute DVT. Will restart at last dose and monitor closely for bleeding.  Goal of Therapy:  Heparin level 0.3-0.7 units/ml Monitor platelets by anticoagulation protocol: Yes   Plan:  Restart heparin at 500 units/hr, no bolus F/u 8hr HL  Monitor daily HL, CBC/plt Monitor for signs/symptoms of bleeding  F/U transition to oral anticoagulant when able (Pending possible VVS procedure)    9/23, PharmD, BCPS, BCCP Clinical Pharmacist  Please check AMION for all [facility] Pharmacy phone numbers After 10:00 PM, call Main Pharmacy [phone]

## 2020-08-08 NOTE — Progress Notes (Signed)
ANTICOAGULATION CONSULT NOTE - Follow Up Consult  Pharmacy Consult for Heparin Indication: 8/16 RLE DVT  No Known Allergies  Patient Measurements: Height: 5\' 7"  (170.2 cm) Weight: 43.1 kg (95 lb) IBW/kg (Calculated) : 66.1 Heparin Dosing Weight:  43.1 kg  Vital Signs: Temp: 97.7 F (36.5 C) (08/23 2200) Temp Source: Oral (08/23 2200) BP: 143/79 (08/23 2200) Pulse Rate: 104 (08/23 2200)  Labs: Recent Labs    08/06/20 0352 08/06/20 0352 08/06/20 1510 08/07/20 0900 08/07/20 1250 08/07/20 1250 08/08/20 0355 08/08/20 0739 08/08/20 2100  HGB 12.3*   < >  --   --  9.2*   < > 8.2* 8.0*  --   HCT 38.2*   < >  --   --  28.6*  --  25.4* 25.2*  --   PLT 130*  --   --   --  143*  --  135*  --   --   HEPARINUNFRC 1.28*  --    < > 0.41  --   --  0.23*  --  0.11*  CREATININE 1.37*  --   --  1.60*  --   --   --  1.40*  --    < > = values in this interval not displayed.    Estimated Creatinine Clearance: 32.5 mL/min (A) (by C-G formula based on SCr of 1.4 mg/dL (H)).  Assessment: 64 yr old male admitted with sepsis, COVID, found to have new DVT and started on IV heparin. Pt also has critical bilateral lower ischemia with wounds.   8/23 AM patient with bleeding from PIV sites, heparin was actually subtherapeutic. Heparin held from 6:50am to 1pm. Per discussion with MD, ok to restart heparin given acute DVT. Will restart at last dose and monitor closely for bleeding.  PM update - Heparin level subtherapeutic at 0.11 after restart at low dose. Hg down to 8, plt low but stable at 135. No current active bleeding or issues with infusion per discussion with RN.  Goal of Therapy:  Heparin level 0.3-0.7 units/ml Monitor platelets by anticoagulation protocol: Yes   Plan:  No bolus with recent bleeding. Increase heparin IV to 600 units/hr Check 6hr heparin level  Monitor daily heparin level and CBC, s/sx bleeding F/U transition to oral anticoagulant when able (Pending possible VVS  procedure)    9/23, PharmD, BCPS Please check AMION for all [facility] Pharmacy contact numbers Clinical Pharmacist 08/08/2020 10:41 PM

## 2020-08-08 NOTE — Progress Notes (Signed)
PROGRESS NOTE    Todd Farrell  ZRA:[id number] DOB: 1956/01/03 DOA: 08-01-2020 PCP: Patient, No Pcp Per    Brief Narrative:  64 year old male with history of type 2 diabetes on insulin, hyperlipidemia, vitamin D deficiency, BPH, chronic kidney disease stage IIIb who presented to the emergency room with confusion and lethargic.  Patient was hypothermic and hypoxic.  In the emergency room, he was tachycardic and leukocytosis with severe lactic acidosis and evidence of DKA.  Treated with IV fluids and insulin drip, broad-spectrum antibiotics. COVID-19 positive.  Blood cultures with E. coli.  Urine culture with insignificant growth.  Clinically improving. He was found with ischemic ulceration of the right toes and gangrene left toes. 8/23, bleeding in the IV line but no bleeding elsewhere, heparin held. Waiting for vascular surgery for the timing.  Assessment & Plan:   Principal Problem:   Sepsis due to COVID-19 [facility]) Active Problems:   Diabetic ketoacidosis without coma associated with type 2 diabetes mellitus (HCC)   Lactic acidosis   Acute respiratory failure with hypoxia (HCC)   Acute renal failure superimposed on stage 3b chronic kidney disease (HCC)   Acute metabolic encephalopathy   Hyperkalemia, diminished renal excretion   Severe protein-calorie malnutrition (HCC)   Prolonged QT interval  Sepsis present on admission due to multiple sources of infection: Sepsis physiology improving.  Continue to treat infection.  Improved.  E. coli bacteremia due to UTI: Treated with IV fluids.  Treated with ceftriaxone then changed to IV cefazolin now changed to Augmentin.   Finished antibiotic therapy.  COVID-19 viral infection with hypoxia: Pulmonary symptoms improved.  He is on room air now.  Continue chest physiotherapy. Increase mobility. Finished therapies.  Peripheral vascular disease with arterial ulcers: Significant ulcerations of the toes, ABI with bilateral obstruction.  Patient has  ischemic ulcer on the right great toe and middle toe Patient has dry gangrene on the left great toe and second toe. Status post angiogram and intervention on the left anterior tibial artery Planned for angiogram and intervention on the right leg next week. At risk of losing both legs. Resume heparin.  Will closely watch for bleeding.  Acute DVT , right posterior tibial acute DVT: Present on admission.  Currently on heparin.  Will change to Eliquis after procedures.  Hyperkalemia: Multifactorial. Improved.  On low potassium diet.  COVID-19 Labs  Recent Labs    08/06/20 0352 08/06/20 1006  DDIMER 0.96*  --   FERRITIN  --  880*  CRP  --  2.5*    Lab Results  Component Value Date   SARSCOV2NAA POSITIVE (A) 08/01/20    SpO2: 91 % O2 Flow Rate (L/min): 2 L/min   Diabetic ketoacidosis: Uncontrolled type 2 diabetes.  Hemoglobin A1c 14.  Unsure about taking any medicine before admission.  Currently started on insulin regimen.  We will continue to monitor and adjust doses. Patient will need long-acting insulin,stable on current doses.  Acute metabolic encephalopathy: Improved.  Nutrition Status: Nutrition Problem: Increased nutrient needs Etiology: catabolic illness (pneumonia secondary COVID-19 virus infection) Signs/Symptoms: estimated needs Interventions: MVI, Hormel Shake, Nepro shake  Acute kidney injury on CKD stage IIIb: Improved with IV fluids.  Presented with creatinine of more than 4, improved with IV fluid resuscitation.  No evidence of obstruction.  Anemia: Acute on chronic.  Acute blood loss anemia from IV line.  Hemoglobin 13-8.  No evidence of internal bleeding.  Will need close monitoring.  Profound physical debility: Work with PT OT.  Recommended SNF for rehab.  DVT prophylaxis: Heparin infusion, regimen.   Code Status: Full code Family Communication: Daughter Leavy CellaJasmine on the phone 8/20. Disposition Plan: Status is: Inpatient  Remains inpatient  appropriate because:IV treatments appropriate due to intensity of illness or inability to take PO   Dispo: The patient is from: Home              Anticipated d/c is to: SNF versus home with home health.              Anticipated d/c date is: After vascular surgery clearance.              Patient currently is not medically stable to d/c.   Waiting for vascular procedure to discharge.      Consultants:   Vascular surgery  Procedures:   None  Antimicrobials:  Antibiotics Given (last 72 hours)    Date/Time Action Medication Dose   08/05/20 1955 Given   amoxicillin-clavulanate (AUGMENTIN) 500-125 MG per tablet 500 mg 500 mg   08/06/20 1013 Given   amoxicillin-clavulanate (AUGMENTIN) 500-125 MG per tablet 500 mg 500 mg   08/06/20 2124 Given   amoxicillin-clavulanate (AUGMENTIN) 500-125 MG per tablet 500 mg 500 mg         Subjective: Seen and examined.  No complaints.  Overnight had noted to be bleeding from IV line.  Heparin was discontinued.  Currently started.  Hemoglobin stabilized after initial drop.  Objective: Vitals:   08/07/20 0756 08/07/20 1659 08/07/20 2019 08/08/20 0546  BP: (!) 150/88 132/78 (!) 145/81 (!) 145/94  Pulse: 91  100 92  Resp: 18 20 20 20   Temp: 98.6 F (37 C) 97.8 F (36.6 C) 97.8 F (36.6 C) 98.3 F (36.8 C)  TempSrc: Oral Oral Oral Oral  SpO2: 92% 90% 91% 91%  Weight:      Height:        Intake/Output Summary (Last 24 hours) at 08/08/2020 1258 Last data filed at 08/08/2020 0900 Gross per 24 hour  Intake 240 ml  Output 800 ml  Net -560 ml   Filed Weights   07/28/20 0012  Weight: 43.1 kg    Examination:  General exam: Appears calm and comfortable , on room air. Chronically sick looking but not in distress.  Alert oriented x4. Respiratory system: Clear to auscultation. Respiratory effort normal.  Some conducted airway sounds. Cardiovascular system: S1 & S2 heard, RRR.  Gastrointestinal system: Abdomen is nondistended, soft and  nontender. No organomegaly or masses felt. Normal bowel sounds heard. Central nervous system: Alert and oriented. No focal neurological deficits.  Generalized weakness. Extremities: Symmetric 5 x 5 power. Skin: No pedal pulse present.   Right toe with ulceration at the tip, dry ulcers. Left toes with dry gangrene of the tips.    Data Reviewed: I have personally reviewed following labs and imaging studies  CBC: Recent Labs  Lab 08/02/20 0500 08/02/20 0500 08/03/20 0437 08/03/20 0437 07/21/2020 0710 07/21/2020 0710 08/05/20 0339 08/06/20 0352 08/07/20 1250 08/08/20 0355 08/08/20 0739  WBC 11.6*   < > 11.9*   < > 11.2*  --  16.9* 13.1* 11.9* 12.4*  --   NEUTROABS 9.5*  --  10.1*  --  9.6*  --  14.9* 11.1*  --   --   --   HGB 13.7   < > 13.7   < > 13.0   < > 12.1* 12.3* 9.2* 8.2* 8.0*  HCT 41.3   < > 41.6   < > 39.6   < >  38.2* 38.2* 28.6* 25.4* 25.2*  MCV 87.5   < > 87.0   < > 87.2  --  88.4 87.4 88.0 88.5  --   PLT 217   < > 212   < > 215  --  184 130* 143* 135*  --    < > = values in this interval not displayed.   Basic Metabolic Panel: Recent Labs  Lab 08/02/20 0500 08/02/20 0500 08/03/20 0437 08/16/2020 0710 08/05/20 0339 08/06/20 0352 08/07/20 0900 08/08/20 0739  NA 142   < > 141  --  133* 139 138 139  K 3.9   < > 4.2  --  5.2* 5.7* 3.6 4.0  CL 108   < > 106  --  108 108 105 109  CO2 22   < > 22  --  14* 20* 23 21*  GLUCOSE 265*   < > 163*  --  282* 78 183* 206*  BUN 39*   < > 32*  --  31* 29* 28* 24*  CREATININE 1.58*   < > 1.51*  --  1.66* 1.37* 1.60* 1.40*  CALCIUM 7.4*   < > 7.5*  --  7.9* 7.5* 7.6* 7.5*  MG 1.4*  --  1.4* 1.4* 1.9 1.8  --   --   PHOS  --   --   --  2.8  --   --   --   --    < > = values in this interval not displayed.   GFR: Estimated Creatinine Clearance: 32.5 mL/min (A) (by C-G formula based on SCr of 1.4 mg/dL (H)). Liver Function Tests: Recent Labs  Lab 08/02/20 0500 08/03/20 0437  AST 27 33  ALT 15 17  ALKPHOS 81 82  BILITOT 0.7  1.1  PROT 5.2* 5.3*  ALBUMIN 1.6* 1.5*   No results for input(s): LIPASE, AMYLASE in the last 168 hours. No results for input(s): AMMONIA in the last 168 hours. Coagulation Profile: No results for input(s): INR, PROTIME in the last 168 hours. Cardiac Enzymes: No results for input(s): CKTOTAL, CKMB, CKMBINDEX, TROPONINI in the last 168 hours. BNP (last 3 results) No results for input(s): PROBNP in the last 8760 hours. HbA1C: No results for input(s): HGBA1C in the last 72 hours. CBG: Recent Labs  Lab 08/07/20 1730 08/07/20 2102 08/08/20 0027 08/08/20 0749 08/08/20 1154  GLUCAP 121* 77 103* 166* 155*   Lipid Profile: No results for input(s): CHOL, HDL, LDLCALC, TRIG, CHOLHDL, LDLDIRECT in the last 72 hours. Thyroid Function Tests: No results for input(s): TSH, T4TOTAL, FREET4, T3FREE, THYROIDAB in the last 72 hours. Anemia Panel: Recent Labs    08/06/20 1006  FERRITIN 880*   Sepsis Labs: No results for input(s): PROCALCITON, LATICACIDVEN in the last 168 hours.  Recent Results (from the past 240 hour(s))  MRSA PCR Screening     Status: None   Collection Time: 07/30/20  6:01 AM   Specimen: Nasal Mucosa; Nasopharyngeal  Result Value Ref Range Status   MRSA by PCR NEGATIVE NEGATIVE Final    Comment:        The GeneXpert MRSA Assay (FDA approved for NASAL specimens only), is one component of a comprehensive MRSA colonization surveillance program. It is not intended to diagnose MRSA infection nor to guide or monitor treatment for MRSA infections. Performed at [facility], [address] [place], [place] [address]          Radiology Studies: No results found.  Scheduled Meds: . albuterol  2 puff Inhalation Q6H  . feeding supplement (ENSURE ENLIVE)  237 mL Oral TID BM  . insulin aspart  0-15 Units Subcutaneous TID WC  . insulin glargine  5 Units Subcutaneous QHS   Continuous Infusions: . heparin       LOS: 11 days    Time spent: 30  minutes    Dorcas Carrow, MD [facility] Pager [phone]

## 2020-08-08 NOTE — Progress Notes (Signed)
ANTICOAGULATION CONSULT NOTE - Follow Up Consult  Pharmacy Consult for heparin Indication: DVT   Labs: Recent Labs    08/05/20 1116 08/05/20 1116 08/06/20 0352 08/06/20 0352 08/06/20 1510 08/06/20 2254 08/07/20 0900 08/07/20 1250 08/08/20 0355  HGB  --   --  12.3*   < >  --   --   --  9.2* 8.2*  HCT  --   --  38.2*  --   --   --   --  28.6* 25.4*  PLT  --   --  130*  --   --   --   --  143* 135*  HEPARINUNFRC 1.70*   < > 1.28*  --    < > 0.67 0.41  --  0.23*  CREATININE  --   --  1.37*  --   --   --  1.60*  --   --   TROPONINIHS 19*  --   --   --   --   --   --   --   --    < > = values in this interval not displayed.    Assessment: 64yo male now subtherapeutic on heparin after one level at goal; no gtt issues or signs of bleeding per RN.  Goal of Therapy:  Heparin level 0.3-0.7 units/ml   Plan:  Will increase heparin gtt slightly to 500 units/hr and check level in 8 hours.    Vernard Gambles, PharmD, BCPS  08/08/2020,5:04 AM

## 2020-08-08 NOTE — Progress Notes (Signed)
  Progress Note    08/08/2020 3:31 PM 4 Days Post-Op  Subjective:  No complaints  Vitals:   08/08/20 0546 08/08/20 1300  BP: (!) 145/94 114/66  Pulse: 92 (!) 106  Resp: 20 20  Temp: 98.3 F (36.8 C) 97.9 F (36.6 C)  SpO2: 91% 90%    Physical Exam: [id number] Non labored breathing Right groin is soft without hematoma Weakly palpable left anterior tibial pulse Stable gangrenous changes left first and second toe Stable gangrenous changes right first toe with nail missing  CBC    Component Value Date/Time   WBC 12.4 (H) 08/08/2020 0355   RBC 2.87 (L) 08/08/2020 0355   HGB 8.0 (L) 08/08/2020 0739   HCT 25.2 (L) 08/08/2020 0739   PLT 135 (L) 08/08/2020 0355   MCV 88.5 08/08/2020 0355   MCH 28.6 08/08/2020 0355   MCHC 32.3 08/08/2020 0355   RDW 14.0 08/08/2020 0355   LYMPHSABS 0.9 08/06/2020 0352   MONOABS 0.8 08/06/2020 0352   EOSABS 0.0 08/06/2020 0352   BASOSABS 0.1 08/06/2020 0352    BMET    Component Value Date/Time   NA 139 08/08/2020 0739   K 4.0 08/08/2020 0739   CL 109 08/08/2020 0739   CO2 21 (L) 08/08/2020 0739   GLUCOSE 206 (H) 08/08/2020 0739   BUN 24 (H) 08/08/2020 0739   CREATININE 1.40 (H) 08/08/2020 0739   CALCIUM 7.5 (L) 08/08/2020 0739   GFRNONAA 53 (L) 08/08/2020 0739   GFRAA >60 08/08/2020 0739    INR    Component Value Date/Time   INR 1.1 07/28/2020 0117     Intake/Output Summary (Last 24 hours) at 08/08/2020 1531 Last data filed at 08/08/2020 0900 Gross per 24 hour  Intake 240 ml  Output 800 ml  Net -560 ml     Assessment/plan:  64 y.o. male is s/p balloon angioplasty left anterior tibial artery.  Will need endovascular evaluation of right below the knee arteries this week likely Wednesday or Thursday.  We will discuss possible first and second toe amputation at the same time.   Emmylou Bieker C. Randie Heinz, MD [facility] Office: [phone] Pager: [phone]  08/08/2020 3:31 PM

## 2020-08-08 NOTE — Progress Notes (Signed)
Called by RN that pt had heavy bleeding from IV sites. Controlled with pressure.  Heparin was turned off Ordered STAT Hgb/Hct to make sure no precipitous drop with report of heavy bleeding.

## 2020-08-09 DIAGNOSIS — A4189 Other specified sepsis: Secondary | ICD-10-CM | POA: Diagnosis not present

## 2020-08-09 DIAGNOSIS — U071 COVID-19: Secondary | ICD-10-CM | POA: Diagnosis not present

## 2020-08-09 LAB — CBC
HCT: 25.7 % — ABNORMAL LOW (ref 39.0–52.0)
Hemoglobin: 8 g/dL — ABNORMAL LOW (ref 13.0–17.0)
MCH: 28.4 pg (ref 26.0–34.0)
MCHC: 31.1 g/dL (ref 30.0–36.0)
MCV: 91.1 fL (ref 80.0–100.0)
Platelets: 114 10*3/uL — ABNORMAL LOW (ref 150–400)
RBC: 2.82 MIL/uL — ABNORMAL LOW (ref 4.22–5.81)
RDW: 14.4 % (ref 11.5–15.5)
WBC: 15.2 10*3/uL — ABNORMAL HIGH (ref 4.0–10.5)
nRBC: 0.1 % (ref 0.0–0.2)

## 2020-08-09 LAB — HEPARIN LEVEL (UNFRACTIONATED)
Heparin Unfractionated: 0.13 IU/mL — ABNORMAL LOW (ref 0.30–0.70)
Heparin Unfractionated: 0.21 IU/mL — ABNORMAL LOW (ref 0.30–0.70)

## 2020-08-09 LAB — GLUCOSE, CAPILLARY
Glucose-Capillary: 126 mg/dL — ABNORMAL HIGH (ref 70–99)
Glucose-Capillary: 113 mg/dL — ABNORMAL HIGH (ref 70–99)
Glucose-Capillary: 271 mg/dL — ABNORMAL HIGH (ref 70–99)
Glucose-Capillary: 115 mg/dL — ABNORMAL HIGH (ref 70–99)
Glucose-Capillary: 93 mg/dL (ref 70–99)

## 2020-08-09 MED ORDER — LABETALOL HCL 5 MG/ML IV SOLN: 10.0000 mg | INTRAVENOUS | Status: DC | PRN

## 2020-08-09 NOTE — Progress Notes (Signed)
   08/08/20 2026  Assess: MEWS Score  Temp 99.2 F (37.3 C)  BP (!) 155/90  Pulse Rate (!) 110  ECG Heart Rate (!) 110  Resp (!) 26  Level of Consciousness Alert  SpO2 90 %  O2 Device Room Air  Patient Activity (if Appropriate) In bed  Assess: MEWS Score  MEWS Temp 0  MEWS Systolic 0  MEWS Pulse 1  MEWS RR 2  MEWS LOC 0  MEWS Score 3  MEWS Score Color Yellow  Assess: if the MEWS score is Yellow or Red  Were vital signs taken at a resting state? Yes  Focused Assessment No change from prior assessment  Early Detection of Sepsis Score *See Row Information* Low  MEWS guidelines implemented *See Row Information* Yes  Treat  Pain Scale 0-10  Pain Score 0  Take Vital Signs  Increase Vital Sign Frequency  Yellow: Q 2hr X 2 then Q 4hr X 2, if remains yellow, continue Q 4hrs  Escalate  MEWS: Escalate Yellow: discuss with charge nurse/RN and consider discussing with provider and RRT  Notify: Charge Nurse/RN  Name of Charge Nurse/RN Notified Dow Adolph, RN  Date Charge Nurse/RN Notified 08/08/20  Time Charge Nurse/RN Notified 2050  Notify: Provider  Provider Name/Title n/a  Notify: Rapid Response  Name of Rapid Response RN Notified n/a  Document  Patient Outcome Stabilized after interventions  Progress note created (see row info) Yes   Yellow MEWS protocol initiated due to tachycardia and tachypnea.  Pt in no acute distress, denies any pain.  Tachycardia resolved once pt went to sleep.  Obtained orders from T. Opyd for PRN labetalol if SBP over 180 or DBP over 100.

## 2020-08-09 NOTE — Progress Notes (Signed)
CSW continuing to follow for SNF. Barriers include being COVID+ and lack of insurance.   Alejandra Hunt LCSW

## 2020-08-09 NOTE — Progress Notes (Signed)
PROGRESS NOTE    Todd Farrell  BWL:[id number] DOB: Feb 06, 1956 DOA: 07/28/2020 PCP: Patient, No Pcp Per    Brief Narrative:  64 year old male with history of type 2 diabetes on insulin, hyperlipidemia, vitamin D deficiency, BPH, chronic kidney disease stage IIIb who presented to the emergency room with confusion and lethargic.  Patient was hypothermic and hypoxic.  In the emergency room, he was tachycardic and leukocytosis with severe lactic acidosis and evidence of DKA.  Treated with IV fluids and insulin drip, broad-spectrum antibiotics. COVID-19 positive.  Blood cultures with E. coli.  Urine culture with insignificant growth.  Clinically improving. He was found with ischemic ulceration of the right toes and gangrene left toes. 8/23, bleeding in the IV line but no bleeding elsewhere, heparin held. Vascular surgery planning for right revascularization and left toe amputations on Thursday.  Assessment & Plan:   Principal Problem:   Sepsis due to COVID-19 [facility]) Active Problems:   Diabetic ketoacidosis without coma associated with type 2 diabetes mellitus (HCC)   Lactic acidosis   Acute respiratory failure with hypoxia (HCC)   Acute renal failure superimposed on stage 3b chronic kidney disease (HCC)   Acute metabolic encephalopathy   Hyperkalemia, diminished renal excretion   Severe protein-calorie malnutrition (HCC)   Prolonged QT interval  Sepsis present on admission due to multiple sources of infection: Sepsis physiology improving.  Continue to treat infection.  Improved.  E. coli bacteremia due to UTI: Treated with IV fluids.  Treated with ceftriaxone then changed to IV cefazolin now changed to Augmentin. Finished antibiotic therapy.  COVID-19 viral infection with hypoxia: Pulmonary symptoms improved.  He is on room air now.  Continue chest physiotherapy. Increase mobility. Finished therapies.  Peripheral vascular disease with arterial ulcers: Significant ulcerations of the toes,  ABI with bilateral obstruction.  Patient has ischemic ulcer on the right great toe and middle toe Patient has dry gangrene on the left great toe and second toe. Status post angiogram and intervention on the left anterior tibial artery Planned for angiogram and intervention on the right leg 8/26. At risk of losing both legs. On heparin drip.  Will need long-term anticoagulation for DVT.  Will closely watch for bleeding.  Acute DVT , right posterior tibial acute DVT: Present on admission.  Currently on heparin.  Will change to Eliquis after procedures.  Hyperkalemia: Multifactorial. Improved.  On low potassium diet.  COVID-19 Labs  No results for input(s): DDIMER, FERRITIN, LDH, CRP in the last 72 hours.  Lab Results  Component Value Date   SARSCOV2NAA POSITIVE (A) 08/07/2020    SpO2: (!) 88 % O2 Flow Rate (L/min): 2 L/min   Diabetic ketoacidosis: Uncontrolled type 2 diabetes.  Hemoglobin A1c 14.  Unsure about taking any medicine before admission.  Currently started on insulin regimen.  We will continue to monitor and adjust doses. Patient will need long-acting insulin,stable on current doses.  Acute metabolic encephalopathy: Improved.  Nutrition Status: Nutrition Problem: Increased nutrient needs Etiology: catabolic illness (pneumonia secondary COVID-19 virus infection) Signs/Symptoms: estimated needs Interventions: MVI, Hormel Shake, Nepro shake  Acute kidney injury on CKD stage IIIb: Improved with IV fluids.  Presented with creatinine of more than 4, improved with IV fluid resuscitation.  No evidence of obstruction.  Anemia: Acute on chronic.  Acute blood loss anemia from IV line.  Hemoglobin 13-8.  No evidence of internal bleeding.  Will need close monitoring.  Profound physical debility: Work with PT OT.  Recommended SNF for rehab.  DVT prophylaxis: Heparin infusion,  regimen.   Code Status: Full code Family Communication: Daughter Leavy Cella on the phone  8/20. Disposition Plan: Status is: Inpatient  Remains inpatient appropriate because:IV treatments appropriate due to intensity of illness or inability to take PO   Dispo: The patient is from: Home              Anticipated d/c is to: SNF versus home with home health.              Anticipated d/c date is: After vascular surgery clearance.              Patient currently is not medically stable to d/c.   Waiting for vascular procedure to discharge.      Consultants:   Vascular surgery  Procedures:   None  Antimicrobials:  Antibiotics Given (last 72 hours)    Date/Time Action Medication Dose   08/06/20 2124 Given   amoxicillin-clavulanate (AUGMENTIN) 500-125 MG per tablet 500 mg 500 mg         Subjective: Patient seen and examined.  He has some stuffy nose.  No other complaints.  He is looking forward for surgery. Objective: Vitals:   08/08/20 2200 08/09/20 0000 08/09/20 0400 08/09/20 0758  BP: (!) 143/79 123/71 (!) 165/79 117/61  Pulse: (!) 104 87 95   Resp: (!) 34 (!) 24 (!) 27 20  Temp: 97.7 F (36.5 C) 98.9 F (37.2 C) 98.9 F (37.2 C) 98.2 F (36.8 C)  TempSrc: Oral Oral Oral Oral  SpO2: (!) 89% (!) 89% (!) 88%   Weight:      Height:        Intake/Output Summary (Last 24 hours) at 08/09/2020 1349 Last data filed at 08/09/2020 1300 Gross per 24 hour  Intake 130.45 ml  Output 700 ml  Net -569.55 ml   Filed Weights   07/28/20 0012  Weight: 43.1 kg    Examination:  General exam: Appears calm and comfortable , on room air. Chronically sick looking but not in distress.  Alert oriented x4. Respiratory system: Clear to auscultation. Respiratory effort normal.  Some conducted airway sounds. Cardiovascular system: S1 & S2 heard, RRR.  Gastrointestinal system: Abdomen is nondistended, soft and nontender. No organomegaly or masses felt. Normal bowel sounds heard. Central nervous system: Alert and oriented. No focal neurological deficits.  Generalized  weakness. Extremities: Symmetric 5 x 5 power. Skin: No pedal pulse present.   Right toe with ulceration at the tip, dry ulcers. Left toes with dry gangrene of the tips.    Data Reviewed: I have personally reviewed following labs and imaging studies  CBC: Recent Labs  Lab 08/03/20 0437 08/03/20 0437 07/30/2020 0710 08/03/2020 0710 08/05/20 0339 08/05/20 0339 08/06/20 0352 08/07/20 1250 08/08/20 0355 08/08/20 0739 08/09/20 0818  WBC 11.9*   < > 11.2*   < > 16.9*  --  13.1* 11.9* 12.4*  --  15.2*  NEUTROABS 10.1*  --  9.6*  --  14.9*  --  11.1*  --   --   --   --   HGB 13.7   < > 13.0   < > 12.1*   < > 12.3* 9.2* 8.2* 8.0* 8.0*  HCT 41.6   < > 39.6   < > 38.2*   < > 38.2* 28.6* 25.4* 25.2* 25.7*  MCV 87.0   < > 87.2   < > 88.4  --  87.4 88.0 88.5  --  91.1  PLT 212   < > 215   < >  184  --  130* 143* 135*  --  114*   < > = values in this interval not displayed.   Basic Metabolic Panel: Recent Labs  Lab 08/03/20 0437 07/19/2020 0710 08/05/20 0339 08/06/20 0352 08/07/20 0900 08/08/20 0739  NA 141  --  133* 139 138 139  K 4.2  --  5.2* 5.7* 3.6 4.0  CL 106  --  108 108 105 109  CO2 22  --  14* 20* 23 21*  GLUCOSE 163*  --  282* 78 183* 206*  BUN 32*  --  31* 29* 28* 24*  CREATININE 1.51*  --  1.66* 1.37* 1.60* 1.40*  CALCIUM 7.5*  --  7.9* 7.5* 7.6* 7.5*  MG 1.4* 1.4* 1.9 1.8  --   --   PHOS  --  2.8  --   --   --   --    GFR: Estimated Creatinine Clearance: 32.5 mL/min (A) (by C-G formula based on SCr of 1.4 mg/dL (H)). Liver Function Tests: Recent Labs  Lab 08/03/20 0437  AST 33  ALT 17  ALKPHOS 82  BILITOT 1.1  PROT 5.3*  ALBUMIN 1.5*   No results for input(s): LIPASE, AMYLASE in the last 168 hours. No results for input(s): AMMONIA in the last 168 hours. Coagulation Profile: No results for input(s): INR, PROTIME in the last 168 hours. Cardiac Enzymes: No results for input(s): CKTOTAL, CKMB, CKMBINDEX, TROPONINI in the last 168 hours. BNP (last 3  results) No results for input(s): PROBNP in the last 8760 hours. HbA1C: No results for input(s): HGBA1C in the last 72 hours. CBG: Recent Labs  Lab 08/08/20 1154 08/08/20 1619 08/08/20 2110 08/09/20 0723 08/09/20 1131  GLUCAP 155* 124* 188* 126* 113*   Lipid Profile: No results for input(s): CHOL, HDL, LDLCALC, TRIG, CHOLHDL, LDLDIRECT in the last 72 hours. Thyroid Function Tests: No results for input(s): TSH, T4TOTAL, FREET4, T3FREE, THYROIDAB in the last 72 hours. Anemia Panel: No results for input(s): VITAMINB12, FOLATE, FERRITIN, TIBC, IRON, RETICCTPCT in the last 72 hours. Sepsis Labs: No results for input(s): PROCALCITON, LATICACIDVEN in the last 168 hours.  No results found for this or any previous visit (from the past 240 hour(s)).       Radiology Studies: No results found.      Scheduled Meds: . albuterol  2 puff Inhalation Q6H  . feeding supplement (ENSURE ENLIVE)  237 mL Oral TID BM  . insulin aspart  0-15 Units Subcutaneous TID WC  . insulin glargine  5 Units Subcutaneous QHS   Continuous Infusions: . heparin 700 Units/hr (08/09/20 1037)     LOS: 12 days    Time spent: 30 minutes    Dorcas Carrow, MD [facility] Pager [phone]

## 2020-08-09 NOTE — Progress Notes (Signed)
ANTICOAGULATION CONSULT NOTE - Follow Up Consult  Pharmacy Consult for Heparin Indication: 8/16 RLE DVT  No Known Allergies  Patient Measurements: Height: 5\' 7"  (170.2 cm) Weight: 43.1 kg (95 lb) IBW/kg (Calculated) : 66.1 Heparin Dosing Weight:  43.1 kg  Vital Signs: Temp: 98.7 F (37.1 C) (08/24 1615) Temp Source: Oral (08/24 1615) BP: 117/61 (08/24 0758)  Labs: Recent Labs    08/07/20 0900 08/07/20 0900 08/07/20 1250 08/08/20 0355 08/08/20 0355 08/08/20 0739 08/08/20 2100 08/09/20 0818 08/09/20 1720  HGB  --    < > 9.2* 8.2*   < > 8.0*  --  8.0*  --   HCT  --    < > 28.6* 25.4*  --  25.2*  --  25.7*  --   PLT  --   --  143* 135*  --   --   --  114*  --   HEPARINUNFRC 0.41   < >  --  0.23*   < >  --  0.11* 0.13* 0.21*  CREATININE 1.60*  --   --   --   --  1.40*  --   --   --    < > = values in this interval not displayed.    Estimated Creatinine Clearance: 32.5 mL/min (A) (by C-G formula based on SCr of 1.4 mg/dL (H)).  Assessment: 64 yr old male admitted with sepsis, COVID, found to have new DVT and started on IV heparin. Pt also has critical bilateral lower ischemia with wounds.   8/23 AM patient with bleeding from PIV sites, heparin was actually subtherapeutic. Heparin held from 6:50am to 1pm. Per discussion with MD, ok to restart heparin given acute DVT and monitor.  Heparin level remains subtherapeutic but trended up to 0.21 after rate increase earlier today. Hg low stable at 8, plt down to 114 today. No bleeding or issues with infusion per discussion with RN.  Goal of Therapy:  Heparin level 0.3-0.7 units/ml Monitor platelets by anticoagulation protocol: Yes   Plan:  No bolus with recent bleeding. Increase heparin IV to 800 units/hr Check 6hr heparin level Monitor daily heparin level and CBC, s/sx bleeding F/U transition to oral anticoagulant when able (Pending possible VVS procedure)    9/23, PharmD, BCPS Please check AMION for all [facility]  Pharmacy contact numbers Clinical Pharmacist 08/09/2020 6:23 PM

## 2020-08-09 NOTE — Plan of Care (Signed)

## 2020-08-09 NOTE — Progress Notes (Signed)
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   BMET Recent Labs    08/07/20 0900 08/08/20 0739  NA 138 139  K 3.6 4.0  CL 105 109  CO2 23 21*  GLUCOSE 183* 206*  BUN 28* 24*  CREATININE 1.60* 1.40*  CALCIUM 7.6* 7.5*    COAG Lab Results  Component Value Date   INR 1.1 07/28/2020   No results found for: PTT  Assessment/Planning:  64 year old male admitted with Covid pneumonia and sepsis.  Critical limb Ischemia bilateral lower extremities with tissue loss.  Has undergone revascularization of the left leg but refused toe amps at the time.  Discussed plan to post him for Thursday in the OR given his Covid status with likely left first and second toe amputtaions and right lower extremity revascularization.  Discussed high risk for toe amps being nonhealing and needing conversion to BKA.  He wishes to proceed.  We will get him posted for Thursday.  Cephus Shelling 08/09/2020 7:51 AM --

## 2020-08-09 NOTE — Progress Notes (Signed)
Physical Therapy Treatment Patient Details Name: Todd Farrell MRN: [id number] DOB: 1956/10/15 Today's Date: 08/09/2020    History of Present Illness Pt is a 64 y.o. male admitted 07/28/2020 with sepsis due to COVID PNA, UTI; also with acute metabolic encephalopathy and DKA. Pt found to have RLE DVT on 8/15 and heparin initiated. S/p LLE angioplasty 8/19. Per vascular sx, plan for OR 8/26 for L foot toe amputations and RLE revascularization; high risk for conversion to L BKA. PMH includes DM, CKD, BPH.   PT Comments    Pt slowly progressing with mobility. Remains limited by generalized weakness, decreased activity tolerance and c/o dizziness with standing activity, requiring up to modA this session. Pt unaware of bowel incontinence, assist for washup and linen change. Pt remains agreeable to post-acute rehab services to maximize functional mobility and independence.  SpO2 88-92% on RA Sitting BP (post-ambulation) 122/76, standing BP 124/66   Follow Up Recommendations  SNF;Supervision for mobility/OOB     Equipment Recommendations   (defer)    Recommendations for Other Services       Precautions / Restrictions Precautions Precautions: Fall Restrictions Weight Bearing Restrictions: No    Mobility  Bed Mobility Overal bed mobility: Needs Assistance Bed Mobility: Supine to Sit     Supine to sit: Supervision;HOB elevated     General bed mobility comments: Repeated cues to complete task  Transfers Overall transfer level: Needs assistance Equipment used: Rolling walker (2 wheeled) Transfers: Sit to/from Stand Sit to Stand: Min assist         General transfer comment: Increased time and effort, minA for trunk elevation and maintaining balance  Ambulation/Gait Ambulation/Gait assistance: Min assist;Mod assist Gait Distance (Feet): 16 Feet Assistive device: Rolling walker (2 wheeled) Gait Pattern/deviations: Step-through pattern;Decreased stride length;Staggering  right;Staggering left;Trunk flexed Gait velocity: Decreased   General Gait Details: Slow, unsteady gait with RW and minA for stability; pt c/o dizziness and fatigue, 1x modA to prevent LOB. BP 122/76   Stairs             Wheelchair Mobility    Modified Rankin (Stroke Patients Only)       Balance Overall balance assessment: Needs assistance Sitting-balance support: No upper extremity supported;Feet supported Sitting balance-Leahy Scale: Fair     Standing balance support: Bilateral upper extremity supported;During functional activity;Single extremity supported Standing balance-Leahy Scale: Poor Standing balance comment: Reliant on single UE support to maintain balance when performing posterior pericare                            Cognition Arousal/Alertness: Awake/alert Behavior During Therapy: WFL for tasks assessed/performed;Flat affect Overall Cognitive Status: No family/caregiver present to determine baseline cognitive functioning Area of Impairment: Following commands;Safety/judgement;Awareness;Problem solving;Orientation;Attention;Memory                   Current Attention Level: Sustained;Selective Memory: Decreased short-term memory Following Commands: Follows one step commands with increased time;Follows multi-step commands inconsistently Safety/Judgement: Decreased awareness of safety;Decreased awareness of deficits Awareness: Intellectual;Emergent Problem Solving: Slow processing;Requires verbal cues;Decreased initiation General Comments: Increased time deciding what age he turned on his birthday a few days ago. Requires multiple repeated cues for simple task (i.e. sitting EOB). Unaware of bowel incontinence      Exercises Other Exercises Other Exercises: Incentive spirometer x10 (pulling ~500 mL) with repeated cues for technique, flutter valve x10 with good technique    General Comments General comments (skin integrity, edema, etc.):  SpO2 88-92% on  RA with activity. Pt c/o dizziness with standing post-ambulation seated BP 122/76, standing BP 124/66.      Pertinent Vitals/Pain Pain Assessment: Faces Faces Pain Scale: Hurts a little bit Pain Location: feet Pain Descriptors / Indicators: Heaviness Pain Intervention(s): Monitored during session    Home Living                      Prior Function            PT Goals (current goals can now be found in the care plan section) Progress towards PT goals: Progressing toward goals    Frequency    Min 2X/week      PT Plan Current plan remains appropriate    Co-evaluation              AM-PAC PT "6 Clicks" Mobility   Outcome Measure  Help needed turning from your back to your side while in a flat bed without using bedrails?: None Help needed moving from lying on your back to sitting on the side of a flat bed without using bedrails?: A Little Help needed moving to and from a bed to a chair (including a wheelchair)?: A Little Help needed standing up from a chair using your arms (e.g., wheelchair or bedside chair)?: A Little Help needed to walk in hospital room?: A Lot Help needed climbing 3-5 steps with a railing? : A Lot 6 Click Score: 17    End of Session   Activity Tolerance: Patient tolerated treatment well;Patient limited by fatigue Patient left: in chair;with call bell/phone within reach;with chair alarm set Nurse Communication: Mobility status PT Visit Diagnosis: Unsteadiness on feet (R26.81);Other abnormalities of gait and mobility (R26.89);Muscle weakness (generalized) (M62.81)     Time: 2774-1287 PT Time Calculation (min) (ACUTE ONLY): 30 min  Charges:  $Therapeutic Exercise: 8-22 mins $Therapeutic Activity: 8-22 mins                    Ina Homes, PT, DPT Acute Rehabilitation Services  Pager [phone] Office [phone]  Malachy Chamber 08/09/2020, 10:53 AM

## 2020-08-09 NOTE — Progress Notes (Signed)
ANTICOAGULATION CONSULT NOTE - Follow Up Consult  Pharmacy Consult for Heparin Indication: 8/16 RLE DVT  No Known Allergies  Patient Measurements: Height: 5\' 7"  (170.2 cm) Weight: 43.1 kg (95 lb) IBW/kg (Calculated) : 66.1 Heparin Dosing Weight:  43.1 kg  Vital Signs: Temp: 98.2 F (36.8 C) (08/24 0758) Temp Source: Oral (08/24 0758) BP: 117/61 (08/24 0758) Pulse Rate: 95 (08/24 0400)  Labs: Recent Labs    08/07/20 0900 08/07/20 0900 08/07/20 1250 08/08/20 0355 08/08/20 0739 08/08/20 2100 08/09/20 0818  HGB  --    < > 9.2* 8.2* 8.0*  --   --   HCT  --   --  28.6* 25.4* 25.2*  --   --   PLT  --   --  143* 135*  --   --   --   HEPARINUNFRC 0.41   < >  --  0.23*  --  0.11* 0.13*  CREATININE 1.60*  --   --   --  1.40*  --   --    < > = values in this interval not displayed.    Estimated Creatinine Clearance: 32.5 mL/min (A) (by C-G formula based on SCr of 1.4 mg/dL (H)).  Assessment: 64 yr old male admitted with sepsis, COVID, found to have new DVT and started on IV heparin. Pt also has critical bilateral lower ischemia with wounds.   8/23 AM patient with bleeding from PIV sites, heparin was actually subtherapeutic. Heparin held from 6:50am to 1pm. Per discussion with MD, ok to restart heparin given acute DVT. Will restart at last dose and monitor closely for bleeding.  Heparin level up slightly after rate increased but remains subtherapeutic at 0.13 on heparin drip rate 600 units/hr. Few days ago the heparin levels were supratherapeutic on 8/19 -08/06/20 on higher heparin rate.  CBC pending.   No current active bleeding or issues with infusion per discussion with RN.  Goal of Therapy:  Heparin level 0.3-0.7 units/ml Monitor platelets by anticoagulation protocol: Yes   Plan:  No bolus with recent bleeding. Increase heparin IV to 700 units/hr Check 6hr heparin level  Monitor daily heparin level and CBC, s/sx bleeding F/U transition to oral anticoagulant when able  (Pending possible VVS procedure)    08/08/20, RPh Clinical Pharmacist Please check AMION for all [facility] contact numbers Clinical Pharmacist 08/09/2020 10:25 AM

## 2020-08-10 ENCOUNTER — Inpatient Hospital Stay (HOSPITAL_COMMUNITY): Payer: HRSA Program

## 2020-08-10 ENCOUNTER — Encounter (HOSPITAL_COMMUNITY): Payer: Self-pay | Admitting: Anesthesiology

## 2020-08-10 DIAGNOSIS — A4189 Other specified sepsis: Secondary | ICD-10-CM | POA: Diagnosis not present

## 2020-08-10 DIAGNOSIS — U071 COVID-19: Secondary | ICD-10-CM | POA: Diagnosis not present

## 2020-08-10 LAB — ABO/RH: ABO/RH(D): B POS

## 2020-08-10 LAB — GLUCOSE, CAPILLARY
Glucose-Capillary: 71 mg/dL (ref 70–99)
Glucose-Capillary: 167 mg/dL — ABNORMAL HIGH (ref 70–99)
Glucose-Capillary: 156 mg/dL — ABNORMAL HIGH (ref 70–99)
Glucose-Capillary: 150 mg/dL — ABNORMAL HIGH (ref 70–99)

## 2020-08-10 LAB — CBC
HCT: 23.1 % — ABNORMAL LOW (ref 39.0–52.0)
Hemoglobin: 7.3 g/dL — ABNORMAL LOW (ref 13.0–17.0)
MCH: 28.5 pg (ref 26.0–34.0)
MCHC: 31.6 g/dL (ref 30.0–36.0)
MCV: 90.2 fL (ref 80.0–100.0)
Platelets: 178 10*3/uL (ref 150–400)
RBC: 2.56 MIL/uL — ABNORMAL LOW (ref 4.22–5.81)
RDW: 14.8 % (ref 11.5–15.5)
WBC: 16.6 10*3/uL — ABNORMAL HIGH (ref 4.0–10.5)
nRBC: 0.1 % (ref 0.0–0.2)

## 2020-08-10 LAB — COMPREHENSIVE METABOLIC PANEL
ALT: 24 U/L (ref 0–44)
AST: 31 U/L (ref 15–41)
Albumin: 1.5 g/dL — ABNORMAL LOW (ref 3.5–5.0)
Alkaline Phosphatase: 83 U/L (ref 38–126)
Anion gap: 9 (ref 5–15)
BUN: 19 mg/dL (ref 8–23)
CO2: 21 mmol/L — ABNORMAL LOW (ref 22–32)
Calcium: 7.4 mg/dL — ABNORMAL LOW (ref 8.9–10.3)
Chloride: 109 mmol/L (ref 98–111)
Creatinine, Ser: 1.36 mg/dL — ABNORMAL HIGH (ref 0.61–1.24)
GFR calc Af Amer: >60 mL/min (ref >60)
GFR calc non Af Amer: 55 mL/min — ABNORMAL LOW (ref >60)
Glucose, Bld: 211 mg/dL — ABNORMAL HIGH (ref 70–99)
Potassium: 4.4 mmol/L (ref 3.5–5.1)
Sodium: 139 mmol/L (ref 135–145)
Total Bilirubin: 1.4 mg/dL — ABNORMAL HIGH (ref 0.3–1.2)
Total Protein: 5 g/dL — ABNORMAL LOW (ref 6.5–8.1)

## 2020-08-10 LAB — BRAIN NATRIURETIC PEPTIDE: B Natriuretic Peptide: 686.5 pg/mL — ABNORMAL HIGH (ref 0.0–100.0)

## 2020-08-10 LAB — PROCALCITONIN: Procalcitonin: 0.66 ng/mL

## 2020-08-10 LAB — HEMOGLOBIN AND HEMATOCRIT, BLOOD
HCT: 25.6 % — ABNORMAL LOW (ref 39.0–52.0)
Hemoglobin: 8.4 g/dL — ABNORMAL LOW (ref 13.0–17.0)

## 2020-08-10 LAB — HEPARIN LEVEL (UNFRACTIONATED)
Heparin Unfractionated: 0.19 IU/mL — ABNORMAL LOW (ref 0.30–0.70)
Heparin Unfractionated: 0.26 IU/mL — ABNORMAL LOW (ref 0.30–0.70)

## 2020-08-10 MED ORDER — SODIUM CHLORIDE 0.9 % IV SOLN
2.0000 g | Freq: Two times a day (BID) | INTRAVENOUS | Status: DC
  Administered 2020-08-10: 2 g via INTRAVENOUS
  Filled 2020-08-10: qty 2

## 2020-08-10 MED ORDER — VANCOMYCIN HCL 750 MG/150ML IV SOLN
750.0000 mg | INTRAVENOUS | Status: DC
  Filled 2020-08-10: qty 150

## 2020-08-10 MED ORDER — HEPARIN (PORCINE) 25000 UT/250ML-% IV SOLN: 1000.0000 [IU]/h | INTRAVENOUS | Status: DC

## 2020-08-10 MED ORDER — VANCOMYCIN HCL IN DEXTROSE 1-5 GM/200ML-% IV SOLN
1000.0000 mg | Freq: Once | INTRAVENOUS | Status: AC
  Administered 2020-08-10: 1000 mg via INTRAVENOUS
  Filled 2020-08-10: qty 200

## 2020-08-10 MED ORDER — HEPARIN (PORCINE) 25000 UT/250ML-% IV SOLN
1100.0000 [IU]/h | INTRAVENOUS | Status: DC
  Administered 2020-08-10: 1000 [IU]/h via INTRAVENOUS
  Administered 2020-08-11: 1100 [IU]/h via INTRAVENOUS
  Filled 2020-08-10: qty 250

## 2020-08-10 NOTE — Progress Notes (Signed)
PROGRESS NOTE    Todd Farrell  QVZ:[id number] DOB: [birth year] DOA: 07/24/2020 PCP: Patient, No Pcp Per  Chief Complaint  Patient presents with  . Altered Mental Status  . Hyperglycemia    Brief Narrative:  64 year old male with history of type 2 diabetes on insulin, hyperlipidemia, vitamin D deficiency, BPH, chronic kidney disease stage IIIb who presented to the emergency room with confusion and lethargic.  Patient was hypothermic and hypoxic.  In the emergency room, he was tachycardic and leukocytosis with severe lactic acidosis and evidence of DKA.  Treated with IV fluids and insulin drip, broad-spectrum antibiotics. COVID-19 positive.  Blood cultures with E. coli.  Urine culture with insignificant growth.  Clinically improving. He was found with ischemic ulceration of the right toes and gangrene left toes. 8/23, bleeding in the IV line but no bleeding elsewhere, heparin held. Vascular surgery planning for right revascularization and left toe amputations on Thursday.  Assessment & Plan:   Principal Problem:   Sepsis due to COVID-19 [facility]) Active Problems:   Diabetic ketoacidosis without coma associated with type 2 diabetes mellitus (HCC)   Lactic acidosis   Acute respiratory failure with hypoxia (HCC)   Acute renal failure superimposed on stage 3b chronic kidney disease (HCC)   Acute metabolic encephalopathy   Hyperkalemia, diminished renal excretion   Severe protein-calorie malnutrition (HCC)   Prolonged QT interval  Sepsis present on admission due to multiple sources of infection: Sepsis physiology improving.  Continue to treat infection.  Improved.  E. coli bacteremia due to UTI:  Therapy completed.  Pansensitive e. Coli on blood culture.  Cefepime 8/11.  Ceftriaxone 8/12 - 8/15.  Augmentin 8/16 - 8/21.   Tachypnea  Tachycardia  Elevated Temperature: on review of previous vitals, looks like this present since 8/23?  Appears comfortable and asx, but red mews with tachypnea  and tachycardia.  CXR with evidence of bilateral airspace disease c/w COVID pneumonia - he's already been treated Will follow BNP and procalcitonin (WBC trending up over past few days, and temp) Follow blood cx Low threshold for abx   COVID-19 viral infection with hypoxia:  Currently on 2 L, satting well CXR 8/25 with bilateral airspace disease c/w COVID pneumonia Remdesivir 8/12-8/16.  Dexamethasone 8/12-8/21. Continue to monitor   Peripheral vascular disease with arterial ulcers: Significant ulcerations of the toes. ABI with moderate RLE extremity arterial disease and L ABI shows critical L limb ischemia Patient has ischemic ulcer on the right great toe and middle toe Patient has dry gangrene on the left great toe and second toe. Status post angiogram and intervention on the left anterior tibial artery Per Dr. Chestine Spore, plan for procedure tomorrow  On heparin drip.  Will need long-term anticoagulation for DVT.  Will closely watch for bleeding.  Acute DVT , right posterior tibial acute DVT: Present on admission.  Currently on heparin.  Will change to Eliquis after procedures.  Hyperkalemia: Multifactorial. Improved.  On low potassium diet.  DVT prophylaxis: heparin gtt Code Status: full  Family Communication: none at bedside Disposition:   Status is: Inpatient  Not inpatient appropriate, will call UM team and downgrade to OBS.   Dispo: The patient is from: Home              Anticipated d/c is to: pending              Anticipated d/c date is: > 3 days              Patient currently is not  medically stable to d/c.  Consultants:   vascular  Procedures:  US LE arterial Summary:  Right: Total occlusion noted in the deep femoral artery.   Left: Total occlusion noted in the posterior tibial artery. Total  occlusion noted in the peroneal artery.   LE US Venous  Summary:  RIGHT:  - Findings consistent with acute deep vein thrombosis involving the right  posterior tibial  veins.  - No cystic structure found in the popliteal fossa.    LEFT:  - There is no evidence of deep vein thrombosis in the lower extremity.  However, portions of this examination were limited- see technologist  comments above.    - No cystic structure found in the popliteal fossa.   ABI Summary:  Right: Resting right ankle-brachial index indicates moderate right lower  extremity arterial disease.   Left: Resting left ankle-brachial index indicates critical left limb  ischemia.   Antimicrobials: Anti-infectives (From admission, onward)   Start     Dose/Rate Route Frequency Ordered Stop   08/02/20 2000  amoxicillin-clavulanate (AUGMENTIN) 500-125 MG per tablet 500 mg        1 tablet Oral 2 times daily 08/02/20 1510 08/06/20 2359   08/01/20 1000  ceFAZolin (ANCEF) IVPB 2g/100 mL premix  Status:  Discontinued        2 g 200 mL/hr over 30 Minutes Intravenous Every 12 hours 07/31/20 1330 08/01/20 0729   08/01/20 0800  amoxicillin-clavulanate (AUGMENTIN) 875-125 MG per tablet 1 tablet  Status:  Discontinued        1 tablet Oral Every 12 hours 08/01/20 0729 08/02/20 1510   07/30/20 1000  doxycycline (VIBRAMYCIN) 100 mg in sodium chloride 0.9 % 250 mL IVPB  Status:  Discontinued        100 mg 125 mL/hr over 120 Minutes Intravenous Every 12 hours 07/30/20 0639 08/01/20 0729   07/29/20 1000  remdesivir 100 mg in sodium chloride 0.9 % 100 mL IVPB  Status:  Discontinued       "Followed by" Linked Group Details   100 mg 200 mL/hr over 30 Minutes Intravenous Daily 07/28/20 0957 07/28/20 1000   07/29/20 1000  remdesivir 100 mg in sodium chloride 0.9 % 100 mL IVPB       "Followed by" Linked Group Details   100 mg 200 mL/hr over 30 Minutes Intravenous Daily 07/28/20 0631 08/01/20 1006   07/28/20 1000  cefTRIAXone (ROCEPHIN) 2 g in sodium chloride 0.9 % 100 mL IVPB  Status:  Discontinued        2 g 200 mL/hr over 30 Minutes Intravenous Every 24 hours 07/28/20 0957 07/31/20 1330   07/28/20  1000  azithromycin (ZITHROMAX) 500 mg in sodium chloride 0.9 % 250 mL IVPB  Status:  Discontinued        500 mg 250 mL/hr over 60 Minutes Intravenous Every 24 hours 07/28/20 0957 07/30/20 0639   07/28/20 0956  remdesivir 200 mg in sodium chloride 0.9% 250 mL IVPB  Status:  Discontinued       "Followed by" Linked Group Details   200 mg 580 mL/hr over 30 Minutes Intravenous Once 07/28/20 0957 07/28/20 1000   07/28/20 0730  remdesivir 200 mg in sodium chloride 0.9% 250 mL IVPB       "Followed by" Linked Group Details   200 mg 580 mL/hr over 30 Minutes Intravenous Once 07/28/20 0631 07/28/20 0936   07/28/20 0700  cefTRIAXone (ROCEPHIN) 1 g in sodium chloride 0.9 % 100 mL IVPB  Status:  Discontinued  1 g 200 mL/hr over 30 Minutes Intravenous Every 24 hours 07/28/20 0659 07/28/20 1000   07/28/20 0700  azithromycin (ZITHROMAX) 500 mg in sodium chloride 0.9 % 250 mL IVPB  Status:  Discontinued        500 mg 250 mL/hr over 60 Minutes Intravenous Every 24 hours 07/28/20 0659 07/28/20 1000   07/28/20 0015  ceFEPIme (MAXIPIME) 2 g in sodium chloride 0.9 % 100 mL IVPB        2 g 200 mL/hr over 30 Minutes Intravenous  Once 07/28/20 0002 07/28/20 0053   07/28/20 0015  metroNIDAZOLE (FLAGYL) IVPB 500 mg  Status:  Discontinued        500 mg 100 mL/hr over 60 Minutes Intravenous Every 8 hours 07/28/20 0002 07/28/20 0659   07/28/20 0015  vancomycin (VANCOCIN) IVPB 1000 mg/200 mL premix        1,000 mg 200 mL/hr over 60 Minutes Intravenous  Once 07/28/20 0002 07/28/20 0250      Subjective: No new complaints  Objective: Vitals:   08/10/20 0400 08/10/20 1452 08/10/20 1544 08/10/20 1646  BP: 128/63 115/67 137/77 (!) 129/45  Pulse: 87 (!) 105  (!) 116  Resp: 20 (!) 39 (!) 31 (!) 36  Temp: 97.7 F (36.5 C) 99 F (37.2 C) 100.1 F (37.8 C) 99.8 F (37.7 C)  TempSrc: Oral Oral Oral Oral  SpO2: 99% 97%    Weight:      Height:        Intake/Output Summary (Last 24 hours) at 08/10/2020  1742 Last data filed at 08/10/2020 0302 Gross per 24 hour  Intake 160.71 ml  Output --  Net 160.71 ml   Filed Weights   07/28/20 0012  Weight: 43.1 kg    Examination:  General exam: Appears calm and comfortable  Respiratory system: Clear to auscultation. Respiratory effort normal. Cardiovascular system: S1 & S2 heard, RRR. Gastrointestinal system: Abdomen is nondistended, soft and nontender. Central nervous system: Alert and oriented. No focal neurological deficits. Extremities: no LEE Skin: No rashes, lesions or ulcers Psychiatry: Judgement and insight appear normal. Mood & affect appropriate.     Data Reviewed: I have personally reviewed following labs and imaging studies  CBC: Recent Labs  Lab September 01, 2020 0710 2020/09/01 0710 08/05/20 0339 08/05/20 0339 08/06/20 0352 08/06/20 0352 08/07/20 1250 08/07/20 1250 08/08/20 0355 08/08/20 0739 08/09/20 0818 08/10/20 0236 08/10/20 1635  WBC 11.2*   < > 16.9*   < > 13.1*  --  11.9*  --  12.4*  --  15.2* 16.6*  --   NEUTROABS 9.6*  --  14.9*  --  11.1*  --   --   --   --   --   --   --   --   HGB 13.0   < > 12.1*   < > 12.3*   < > 9.2*   < > 8.2* 8.0* 8.0* 7.3* 8.4*  HCT 39.6   < > 38.2*   < > 38.2*   < > 28.6*   < > 25.4* 25.2* 25.7* 23.1* 25.6*  MCV 87.2   < > 88.4   < > 87.4  --  88.0  --  88.5  --  91.1 90.2  --   PLT 215   < > 184   < > 130*  --  143*  --  135*  --  114* 178  --    < > = values in this interval not displayed.    Basic  Metabolic Panel: Recent Labs  Lab 07/21/2020 0710 08/05/20 0339 08/06/20 0352 08/07/20 0900 08/08/20 0739 08/10/20 1100  NA  --  133* 139 138 139 139  K  --  5.2* 5.7* 3.6 4.0 4.4  CL  --  108 108 105 109 109  CO2  --  14* 20* 23 21* 21*  GLUCOSE  --  282* 78 183* 206* 211*  BUN  --  31* 29* 28* 24* 19  CREATININE  --  1.66* 1.37* 1.60* 1.40* 1.36*  CALCIUM  --  7.9* 7.5* 7.6* 7.5* 7.4*  MG 1.4* 1.9 1.8  --   --   --   PHOS 2.8  --   --   --   --   --     GFR: Estimated  Creatinine Clearance: 33.5 mL/min ([place]) (by C-G formula based on SCr of 1.36 mg/dL (H)).  Liver Function Tests: Recent Labs  Lab 08/10/20 1100  AST 31  ALT 24  ALKPHOS 83  BILITOT 1.4*  PROT 5.0*  ALBUMIN 1.5*    CBG: Recent Labs  Lab 08/09/20 2110 08/09/20 2121 08/10/20 0744 08/10/20 1145 08/10/20 1608  GLUCAP 115* 93 71 167* 156*     No results found for this or any previous visit (from the past 240 hour(s)).       Radiology Studies: DG CHEST PORT 1 VIEW  Result Date: 08/10/2020 CLINICAL DATA:  Encounter for hypoxia. COVID + EXAM: PORTABLE CHEST 1 VIEW COMPARISON:  None. FINDINGS: Cardiac silhouette is partially obscured by airspace disease. Extremely low lung volumes. There is bibasilar airspace disease as well as RIGHT upper lobe airspace disease. No pneumothorax. IMPRESSION: Bilateral airspace disease consistent with COVID pneumonia. Very low lung volumes. Electronically Signed   By: Genevive Bi M.D.   On: 08/10/2020 16:41        Scheduled Meds: . albuterol  2 puff Inhalation Q6H  . feeding supplement (ENSURE ENLIVE)  237 mL Oral TID BM  . insulin aspart  0-15 Units Subcutaneous TID WC  . insulin glargine  5 Units Subcutaneous QHS   Continuous Infusions: . heparin 1,000 Units/hr (08/10/20 1222)     LOS: 13 days    Time spent: over 30 min    Lacretia Nicks, MD [facility]   To contact the attending provider between 7A-7P or the covering provider during after hours 7P-7A, please log into the web site [email or web address] and access using universal  password for that web site. If you do not have the password, please call the hospital operator.  08/10/2020, 5:42 PM

## 2020-08-10 NOTE — Progress Notes (Signed)
   08/09/20 2000  Assess: MEWS Score  Temp 98.5 F (36.9 C)  BP (!) 133/97  Pulse Rate (!) 111  ECG Heart Rate (!) 111  Resp (!) 29  Level of Consciousness Alert  SpO2 94 %  O2 Device Room Air  Patient Activity (if Appropriate) In bed  Assess: MEWS Score  MEWS Temp 0  MEWS Systolic 0  MEWS Pulse 2  MEWS RR 2  MEWS LOC 0  MEWS Score 4  MEWS Score Color Red  Assess: if the MEWS score is Yellow or Red  Were vital signs taken at a resting state? Yes  Focused Assessment No change from prior assessment  Early Detection of Sepsis Score *See Row Information* Low  MEWS guidelines implemented *See Row Information* Yes  Treat  MEWS Interventions Other (Comment) (increased vital sign frequency)  Pain Scale 0-10  Pain Score 0  Take Vital Signs  Increase Vital Sign Frequency  Red: Q 1hr X 4 then Q 4hr X 4, if remains red, continue Q 4hrs  Escalate  MEWS: Escalate Red: discuss with charge nurse/RN and provider, consider discussing with RRT  Notify: Charge Nurse/RN  Name of Charge Nurse/RN Notified Blima Singer, RN  Date Charge Nurse/RN Notified 08/09/20  Time Charge Nurse/RN Notified 2000  Notify: Provider  Provider Name/Title n/a  Notify: Rapid Response  Name of Rapid Response RN Notified n/a  Document  Patient Outcome Stabilized after interventions  Progress note created (see row info) Yes   Pt noted to be red MEWS at start of shift d/t tachycardia and tachypnea.  Pt had similar presentation the night before.  Red protocol initiated, vital sign frequency increased.  Pt would have occasional periods of desaturation, added 1-2 liters nasal cannula.

## 2020-08-10 NOTE — Progress Notes (Signed)
ANTICOAGULATION CONSULT NOTE - Follow Up Consult  Pharmacy Consult for heparin Indication: 8/16 RLE DVT  Labs: Recent Labs    08/08/20 0355 08/08/20 0355 08/08/20 0739 08/08/20 2100 08/09/20 0818 08/09/20 0818 08/09/20 1720 08/10/20 0235 08/10/20 0236 08/10/20 1100  HGB 8.2*   < > 8.0*  --  8.0*  --   --   --  7.3*  --   HCT 25.4*   < > 25.2*  --  25.7*  --   --   --  23.1*  --   PLT 135*  --   --   --  114*  --   --   --  178  --   HEPARINUNFRC 0.23*  --   --    < > 0.13*   < > 0.21* 0.19*  --  0.26*  CREATININE  --   --  1.40*  --   --   --   --   --   --   --    < > = values in this interval not displayed.    Assessment: 64 yr old male admitted with sepsis, COVID, found to have new DVT and started on IV heparin. Pt also has critical bilateral lower ischemia with wounds.   HL 0.26 still subtherapeutic but up trending. Per RN, no further bleeding from PIV lines and no bruises found on upper extremities as reported yesterday. Planned procedure 8/26.   Goal of Therapy:  Heparin level 0.3-0.7 units/ml Monitor platelets by anticoagulation protocol: Yes  Plan:  Incr heparin to 1000 units/hr Monitor daily HL, CBC/plt Monitor for signs/symptoms of bleeding  F/u heparin plan and bleeding after VVS Procedure 8/26   Alphia Moh, PharmD, BCPS, [facility] Clinical Pharmacist  Please check AMION for all [facility] Pharmacy phone numbers After 10:00 PM, call Main Pharmacy [phone]

## 2020-08-10 NOTE — Progress Notes (Signed)
Pharmacy Antibiotic Note  Todd Farrell is a 64 y.o. male admitted on 08/07/2020 with sepsis.  Pharmacy has been consulted for Vancomycin and Cefepime dosing.  Plan: Cefepime 2gm IV q12h Vancomycin 1gm IV now then 750 mg IV Q 24 hrs Will f/u renal function, micro data, and pt's clinical condition Vanc levels prn   Height: 5\' 7"  (170.2 cm) Weight: 43.1 kg (95 lb) IBW/kg (Calculated) : 66.1  Temp (24hrs), Avg:99.3 F (37.4 C), Min:97.7 F (36.5 C), Max:100.4 F (38 C)  Recent Labs  Lab 08/05/20 0339 08/05/20 0339 08/06/20 0352 08/07/20 0900 08/07/20 1250 08/08/20 0355 08/08/20 0739 08/09/20 0818 08/10/20 0236 08/10/20 1100  WBC 16.9*   < > 13.1*  --  11.9* 12.4*  --  15.2* 16.6*  --   CREATININE 1.66*  --  1.37* 1.60*  --   --  1.40*  --   --  1.36*   < > = values in this interval not displayed.    Estimated Creatinine Clearance: 33.5 mL/min (A) (by C-G formula based on SCr of 1.36 mg/dL (H)).    No Known Allergies  Antimicrobials this admission: Cefepime x 1 8/12; restarted 8/25>> Flagyl x 1 8/12 Vanc x 1 8/12; restarted 8/25>> CTX 8/12>>8/15 Azithro 8/12>> 8/13 (d/ced d/t QTc) Doxy 8/14 >>8/15 Remdes 8/12>>8/16 Cefazolin 8/15>>8/16 Augmentin 8/16>>  8/21    Microbiology results: 8/11 Blood - Ecoli (pan-sensitive) 8/14 MRSA PCR >> neg 8/25 UCx: 8/25 BCx:  Thank you for allowing pharmacy to be a part of this patient's care.  9/25, PharmD, BCPS Please see amion for complete clinical pharmacist phone list 08/10/2020 10:50 PM

## 2020-08-10 NOTE — Progress Notes (Signed)
ANTICOAGULATION CONSULT NOTE - Follow Up Consult  Pharmacy Consult for heparin Indication: DVT  Labs: Recent Labs    08/07/20 0900 08/07/20 1250 08/08/20 0355 08/08/20 0355 08/08/20 0739 08/08/20 2100 08/09/20 0818 08/09/20 1720 08/10/20 0235 08/10/20 0236  HGB  --    < > 8.2*   < > 8.0*  --  8.0*  --   --  7.3*  HCT  --    < > 25.4*   < > 25.2*  --  25.7*  --   --  23.1*  PLT  --    < > 135*  --   --   --  114*  --   --  178  HEPARINUNFRC 0.41   < > 0.23*  --   --    < > 0.13* 0.21* 0.19*  --   CREATININE 1.60*  --   --   --  1.40*  --   --   --   --   --    < > = values in this interval not displayed.    Assessment: 64yo male subtherapeutic on heparin with lower heparin level despite increased rate; RN reports that IV line was kinked, line now changed and running well; he also notes a bruise to UE, will monitor.  Goal of Therapy:  Heparin level 0.3-0.7 units/ml   Plan:  Will increase heparin gtt by 2 units/kg/hr to 900 units/hr and check level in 8 hours.    Vernard Gambles, PharmD, BCPS  08/10/2020,3:16 AM

## 2020-08-10 NOTE — Progress Notes (Signed)
64 year old male seen with critical limb ischemia of the bilateral lower extremities with tissue loss in setting of Covid sepsis and pneumonia.  He previously underwent left leg intervention last week with Dr. Randie Heinz.  Discussed with him in depth yesterday plan for OR tomorrow for right leg intervention given he has a right great toenail that has fallen off and likely severe tibial disease.  At the same time I recommended amputation of left toes 1 and 2.  He is agreeable to proceed.  Please keep him n.p.o. after midnight.  Cephus Shelling, MD [facility] Office: [phone]   Cephus Shelling

## 2020-08-11 ENCOUNTER — Inpatient Hospital Stay (HOSPITAL_COMMUNITY): Payer: HRSA Program | Admitting: Certified Registered Nurse Anesthetist

## 2020-08-11 ENCOUNTER — Encounter (HOSPITAL_COMMUNITY): Payer: Self-pay | Admitting: Internal Medicine

## 2020-08-11 ENCOUNTER — Inpatient Hospital Stay (HOSPITAL_COMMUNITY): Payer: HRSA Program

## 2020-08-11 ENCOUNTER — Encounter (HOSPITAL_COMMUNITY): Admission: EM | Disposition: E | Payer: Self-pay | Source: Home / Self Care | Attending: Internal Medicine

## 2020-08-11 ENCOUNTER — Other Ambulatory Visit (HOSPITAL_COMMUNITY): Payer: Medicaid Other

## 2020-08-11 DIAGNOSIS — U071 COVID-19: Principal | ICD-10-CM

## 2020-08-11 DIAGNOSIS — I469 Cardiac arrest, cause unspecified: Secondary | ICD-10-CM

## 2020-08-11 DIAGNOSIS — A4189 Other specified sepsis: Secondary | ICD-10-CM

## 2020-08-11 LAB — PHOSPHORUS: Phosphorus: 2.4 mg/dL — ABNORMAL LOW (ref 2.5–4.6)

## 2020-08-11 LAB — URINALYSIS, ROUTINE W REFLEX MICROSCOPIC
Bilirubin Urine: NEGATIVE
Glucose, UA: >=500 mg/dL — AB
Ketones, ur: 5 mg/dL — AB
Nitrite: NEGATIVE
Protein, ur: 30 mg/dL — AB
RBC / HPF: >50 RBC/hpf — ABNORMAL HIGH (ref 0–5)
Specific Gravity, Urine: 1.013 (ref 1.005–1.030)
pH: 6 (ref 5.0–8.0)

## 2020-08-11 LAB — MAGNESIUM: Magnesium: 1.4 mg/dL — ABNORMAL LOW (ref 1.7–2.4)

## 2020-08-11 LAB — COMPREHENSIVE METABOLIC PANEL
ALT: 23 U/L (ref 0–44)
AST: 24 U/L (ref 15–41)
Albumin: 1.5 g/dL — ABNORMAL LOW (ref 3.5–5.0)
Alkaline Phosphatase: 81 U/L (ref 38–126)
Anion gap: 13 (ref 5–15)
BUN: 23 mg/dL (ref 8–23)
CO2: 17 mmol/L — ABNORMAL LOW (ref 22–32)
Calcium: 7.4 mg/dL — ABNORMAL LOW (ref 8.9–10.3)
Chloride: 105 mmol/L (ref 98–111)
Creatinine, Ser: 1.84 mg/dL — ABNORMAL HIGH (ref 0.61–1.24)
GFR calc Af Amer: 44 mL/min — ABNORMAL LOW (ref >60)
GFR calc non Af Amer: 38 mL/min — ABNORMAL LOW (ref >60)
Glucose, Bld: 313 mg/dL — ABNORMAL HIGH (ref 70–99)
Potassium: 4.7 mmol/L (ref 3.5–5.1)
Sodium: 135 mmol/L (ref 135–145)
Total Bilirubin: 1.3 mg/dL — ABNORMAL HIGH (ref 0.3–1.2)
Total Protein: 5.2 g/dL — ABNORMAL LOW (ref 6.5–8.1)

## 2020-08-11 LAB — PREPARE RBC (CROSSMATCH)

## 2020-08-11 LAB — PROCALCITONIN: Procalcitonin: 1.13 ng/mL

## 2020-08-11 LAB — D-DIMER, QUANTITATIVE: D-Dimer, Quant: 2.28 ug/mL-FEU — ABNORMAL HIGH (ref 0.00–0.50)

## 2020-08-11 LAB — HEPARIN LEVEL (UNFRACTIONATED): Heparin Unfractionated: 0.25 IU/mL — ABNORMAL LOW (ref 0.30–0.70)

## 2020-08-11 LAB — GLUCOSE, CAPILLARY: Glucose-Capillary: 319 mg/dL — ABNORMAL HIGH (ref 70–99)

## 2020-08-11 LAB — LACTIC ACID, PLASMA: Lactic Acid, Venous: 3 mmol/L (ref 0.5–1.9)

## 2020-08-11 SURGERY — AMPUTATION DIGIT
Anesthesia: Choice | Laterality: Right

## 2020-08-11 MED ORDER — EPINEPHRINE 1 MG/10ML IJ SOSY
PREFILLED_SYRINGE | INTRAMUSCULAR | Status: AC
  Filled 2020-08-11: qty 20

## 2020-08-11 MED ORDER — SODIUM CHLORIDE 0.9 % IV SOLN: INTRAVENOUS | Status: DC

## 2020-08-11 MED ORDER — TENECTEPLASE 50 MG IV KIT
30.0000 mg | PACK | INTRAVENOUS | Status: AC
  Administered 2020-08-11: 30 mg via INTRAVENOUS
  Filled 2020-08-11: qty 10

## 2020-08-11 MED ORDER — SODIUM CHLORIDE 0.9 % IV BOLUS
500.0000 mL | Freq: Once | INTRAVENOUS | Status: AC
  Administered 2020-08-11: 500 mL via INTRAVENOUS

## 2020-08-11 MED ORDER — ROCURONIUM BROMIDE 10 MG/ML (PF) SYRINGE
PREFILLED_SYRINGE | INTRAVENOUS | Status: AC
  Filled 2020-08-11: qty 10

## 2020-08-11 MED ORDER — SODIUM CHLORIDE 0.9% IV SOLUTION: Freq: Once | INTRAVENOUS | Status: DC

## 2020-08-11 MED ORDER — LIDOCAINE 2% (20 MG/ML) 5 ML SYRINGE
INTRAMUSCULAR | Status: AC
  Filled 2020-08-11: qty 5

## 2020-08-11 MED ORDER — DEXAMETHASONE SODIUM PHOSPHATE 10 MG/ML IJ SOLN
INTRAMUSCULAR | Status: AC
  Filled 2020-08-11: qty 1

## 2020-08-11 MED ORDER — ONDANSETRON HCL 4 MG/2ML IJ SOLN
INTRAMUSCULAR | Status: AC
  Filled 2020-08-11: qty 2

## 2020-08-11 MED ORDER — MIDAZOLAM HCL 2 MG/2ML IJ SOLN
INTRAMUSCULAR | Status: AC
  Filled 2020-08-11: qty 2

## 2020-08-11 MED ORDER — SUCCINYLCHOLINE CHLORIDE 200 MG/10ML IV SOSY
PREFILLED_SYRINGE | INTRAVENOUS | Status: AC
  Filled 2020-08-11: qty 10

## 2020-08-11 MED ORDER — FENTANYL CITRATE (PF) 250 MCG/5ML IJ SOLN
INTRAMUSCULAR | Status: AC
  Filled 2020-08-11: qty 5

## 2020-08-11 MED FILL — Medication: Qty: 1 | Status: AC

## 2020-08-12 LAB — TYPE AND SCREEN
ABO/RH(D): B POS
Antibody Screen: NEGATIVE
Unit division: 0

## 2020-08-12 LAB — BPAM RBC
Blood Product Expiration Date: 202109022359
Unit Type and Rh: 1700

## 2020-08-15 LAB — CULTURE, BLOOD (ROUTINE X 2)
Culture: NO GROWTH
Culture: NO GROWTH
Special Requests: ADEQUATE

## 2020-08-17 NOTE — Progress Notes (Addendum)
PCCM note  Called to the bedside to assist in CODE BLUE Patient with multiple comorbidities including diabetes, hyperlipidemia, vitamin D, CKD admitted with DKA, COVID-19 pneumonia, E. 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 Lowell Guitar, [facility] hospitalist team  The patient is critically ill with multiple organ system failure and requires high complexity decision making for assessment and support, frequent evaluation and titration of therapies, advanced monitoring, review of radiographic studies and interpretation of complex data.   Critical Care Time devoted to patient care services, exclusive of separately billable procedures, described in this note is 45 minutes.   [name] MD [facility] Please see [email or web address] for pager details.  07/20/2020, 11:48 AM

## 2020-08-17 NOTE — Anesthesia Procedure Notes (Addendum)
Procedure Name: Intubation Date/Time: 2020-09-06 10:08 AM Performed by: Janace Litten, CRNA Pre-anesthesia Checklist: Patient identified, Emergency Drugs available, Suction available and Patient being monitored Patient Re-evaluated:Patient Re-evaluated prior to induction Oxygen Delivery Method: Ambu bag Preoxygenation: Pre-oxygenation with 100% oxygen Laryngoscope Size: Mac and 4 Grade View: Grade I Tube size: 8.0 mm Number of attempts: 1 Airway Equipment and Method: Stylet Placement Confirmation: CO2 detector and ETT inserted through vocal cords under direct vision Secured at: 23 cm Tube secured with: Tape

## 2020-08-17 NOTE — Progress Notes (Signed)
   08/02/2020 0954  Clinical Encounter Type  Visited With Health care provider  Visit Type Code  Referral From Nurse  Consult/Referral To Chaplain  Spiritual Encounters  Spiritual Needs Prayer;Emotional   Chaplain responded to code blue. No family present. Chaplain offered silent prayer outside of pt room as medical team worked to revive pt multiple times until pt's body simply gave out. Chaplain remains available for support as needs arise.   Chaplain Resident, Amado Coe, MDiv [phone] on-call pager

## 2020-08-17 NOTE — Progress Notes (Signed)
Pt was AOx3 this morning. On hep gtt. Vital signs per flowsheet. Pt scored red on the MEWS. Vitals was taken Q4H and as needed.  Denied pain. Tachypenic  during rest. Pt state he felt dizzy. Attending was notified. NS bolus was given per order. Pt was reassessed and pt was noticed to have sclera edema. Attending was notified. Shortly after CCMD called stating pt's HR was down to low 30's and sustaining. Pt was unresponsive to verbal command and physical touch. Code blue was called 0958. CPR was started. Pt shocked x3 during CPR. Pt was intubated. Epi given x4. Bicarb given x2. IV fluid given, Dopamine given, Mg 2 gram given, Amiodarone given x1. Levophed given x1. Calcium given x1. TNK x1 and IO placed. Pt's pulse was felt at 1014. Then at 1045 pt coded again. CPR initiated. Was able to to get a pulse via doppler at 1049. Pt was being planned to be moved down to another floor, but was unstable. 1100 pt was pronounced dead by Dr. Isaiah Serge. Post mortem cared provided by aide and this nurse. [facility] called. Per caller pt is not qualified to be a donor.

## 2020-08-17 NOTE — Progress Notes (Signed)
°  °  Called by primary team that patient not suitable for surgery today given tachypnea and general medical decompensation.  Unlikely related to dry gangrene of left toes.  We will defer surgery until patient improves clinically.  Taksh Hjort C. Randie Heinz, MD [facility] Office: [phone] Pager: [phone]

## 2020-08-17 NOTE — Progress Notes (Signed)
PT Cancellation Note  Patient Details Name: Todd Farrell MRN: [id number] DOB: 1956-08-01   Cancelled Treatment:    Reason Eval/Treat Not Completed: Patient not medically ready (code blue called for patient). Will follow-up for PT treatment as appropriate.  Ina Homes, PT, DPT Acute Rehabilitation Services  Pager [phone] Office [phone]  Malachy Chamber 08/09/2020, 10:43 AM

## 2020-08-17 NOTE — Progress Notes (Signed)
OT Cancellation Note  Patient Details Name: Todd Farrell MRN: [id number] DOB: [birth year]   Cancelled Treatment:    Reason Eval/Treat Not Completed: Medical issues which prohibited therapy (Pt with code blue today. Will follow until available and appropriate)  Dalphine Handing, MSOT, OTR/L Acute Rehabilitation Services [facility] Office Number: [phone] Pager: [phone]  Dalphine Handing 07/27/2020, 10:15 AM

## 2020-08-17 NOTE — Progress Notes (Signed)
ANTICOAGULATION CONSULT NOTE - Follow Up Consult  Pharmacy Consult for heparin Indication: 8/16 RLE DVT  Labs: Recent Labs    08/08/20 0739 08/08/20 2100 08/09/20 0818 08/09/20 0818 08/09/20 1720 08/10/20 0235 08/10/20 0236 08/10/20 1100 08/10/20 1635 07/22/2020 0324  HGB 8.0*  --  8.0*   < >  --   --  7.3*  --  8.4*  --   HCT 25.2*  --  25.7*  --   --   --  23.1*  --  25.6*  --   PLT  --   --  114*  --   --   --  178  --   --   --   HEPARINUNFRC  --    < > 0.13*  --    < > 0.19*  --  0.26*  --  0.25*  CREATININE 1.40*  --   --   --   --   --   --  1.36*  --  1.84*   < > = values in this interval not displayed.    Assessment: 64 yr old male admitted with sepsis, COVID, found to have new DVT and started on IV heparin. Pt also has critical bilateral lower ischemia with wounds.   HL 0.25 (remains subtherapeutic) on 1000 units/hr. Noted plan for VVS procedure today - scheduled for 1015.  Goal of Therapy:  Heparin level 0.3-0.7 units/ml Monitor platelets by anticoagulation protocol: Yes  Plan:  Incr heparin to 1100 units/hr F/u heparin plan and bleeding after VVS Procedure 8/26   Christoper Fabian, PharmD, BCPS Please see amion for complete clinical pharmacist phone list 07/26/2020 5:58 AM

## 2020-08-17 NOTE — Death Summary Note (Addendum)
DEATH SUMMARY   Patient Details  Name: Todd Farrell MRN: [id number] DOB: 1956-06-02  Admission/Discharge Information   Admit Date:  2020-07-30  Date of Death:    Time of Death:    Length of Stay: 04-03-23  Referring Physician: Patient, No Pcp Per   Reason(s) for Hospitalization  COVID 19 pneumonia, E. coli bacteremia, sepsis, DKA  Diagnoses  Preliminary cause of death:  Secondary Diagnoses (including complications and co-morbidities):  Principal Problem:   Sepsis due to COVID-19 [facility]) Active Problems:   Diabetic ketoacidosis without coma associated with type 2 diabetes mellitus (HCC)   Lactic acidosis   Acute respiratory failure with hypoxia (HCC)   Acute renal failure superimposed on stage 3b chronic kidney disease (HCC)   Acute metabolic encephalopathy   Hyperkalemia, diminished renal excretion   Severe protein-calorie malnutrition (HCC)   Prolonged QT interval   Brief Hospital Course (including significant findings, care, treatment, and services provided and events leading to death)  Todd Farrell is Todd Farrell 64 y.o. year old male has Todd Farrell history of type 2 diabetes on insulin, hyperlipidemia, vitamin D deficiency, BPH, chronic kidney disease stage IIIb who presented to the emergency room with confusion and lethargic. Patient was hypothermic and hypoxic. In the emergency room, he was tachycardic and leukocytosis with severe lactic acidosis and evidence of DKA. Treated with IV fluids and insulin drip, broad-spectrum antibiotics. COVID-19 positive. Blood cultures with E. coli. Urine culture with insignificant growth. He was treated for COVID 19 pneumonia with steroids and remdesivir.  He was treated for E. Coli bacteremia with appropriate abx.  He had bilateral lower extremity toe wounds and evidence of total occlusion in the posterior tibial artery and total occlusion in the peroneal artery.  ABI showed critical left limb ischemia on the left and moderate right lower extremity arterial disease.    He had vascular procedure on 8/19 with balloon angioplasty of the left AT.  There was plan for right leg intervention and toe amputations on today.  He had Todd Farrell right lower extremity DVT and was on treatment with heparin.  He'd been noted to have worsening tachycardia and tachypnea as well as fever on 8/25.  With concern for sepsis, he given IVF and given antibiotics.  Blood cultures were pending.  Imaging showed evidence of covid pneumonia (he'd already been treated for COVID).  I discussed his procedure on 8/26 with vascular and we canceled this due to his abnormal vital signs.  Unfortunately he coded.  He was notably given TNK with Todd Farrell history of DVT and concern for PE, but unfortunately he passed away despite resuscitation efforts.   See below for additional details and details regarding plan for today prior to death  Sepsis:  Developed fever yesterday as well as persistent tachycardia and tachypnea.  Abx started.  Given IVF.   Potential sources include HAP with evidence of COVID pneumonia on imaging (he's already been treated for covid) as well as infection related to gangrenous toes (thought less likely related to dry gangrene), etc Blood and urine cx pending Elevated lactate - repeat pending, IVF given (elevated BNP - echo ordered, some caution with resp status/bnp) Repeat CXR today with stable bilateral lung opacities   E. coli bacteremia due to UTI:  Therapy completed.  Pansensitive e. Coli on blood culture.  Cefepime 07/31/2023.  Ceftriaxone 8/12 - 8/15.  Augmentin 8/16 - 8/21. Repeat cx as noted above   COVID-19 viral infection with hypoxia:  Satting well on 2 L this AM, increased to 6 with  reported lightheadedness and resp effort CXR 8/25 with bilateral airspace disease c/w COVID pneumonia Remdesivir 8/12-8/16.  Dexamethasone 8/12-8/21. Continue to monitor  Abx as noted above  Peripheral vascular disease with arterial ulcers: Significant ulcerations of the toes. 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 Status post angiogram and intervention on the left anterior tibial artery Discussed with Dr. Randie Heinz today, hold off on procedure given decline On heparin drip. Will need long-term anticoagulation for DVT. Will closely watch for bleeding.  Acute DVT , right posterior tibial acute DVT: Present on admission. Currently on heparin (subtherapeutic ). Will change to Eliquis after procedures.  Hyperkalemia: Multifactorial. Improved. On low potassium diet. CBC not resulted  Pt seen and examined this AM prior to code.  C/o lightheadedness this AM, but no other localizing sx.  Exam notable for appeared uncomfortable.   tachypnea without clear adventitious lung sounds (he was satting well on 2 L, but increased to 6 by RN for comfort and reported LH).  Tachycardia, regular rate and rhythm.  abd s/nt/nd.  No LEE.  Planned for IVF, abx, CXR, echo, hold surgery.       I was present during code.  See Dr. Shirlee More code documendation.  Pertinent Labs and Studies  Significant Diagnostic Studies CT HEAD WO CONTRAST  Result Date: 07/28/2020 CLINICAL DATA:  Delirium, altered mental status EXAM: CT HEAD WITHOUT CONTRAST TECHNIQUE: Contiguous axial images were obtained from the base of the skull through the vertex without intravenous contrast. COMPARISON:  None. FINDINGS: Brain: Normal anatomic configuration. Mild periventricular white matter changes are present likely reflecting the sequela of small vessel ischemia. No abnormal intra or extra-axial mass lesion or fluid collection. No abnormal mass effect or midline shift. No evidence of acute intracranial hemorrhage or infarct. Ventricular size is normal. Cerebellum unremarkable. Vascular: Unremarkable Skull: Intact Sinuses/Orbits: Moderate to severe mucosal thickening and  debris within the left maxillary sinus with dystrophic calcification within the intraluminal debris in keeping with chronic or fungal sinusitis. There is mild mucosal thickening within the ethmoid air cells, and sphenoid sinuses as well as Todd Farrell small air-fluid level within the right maxillary sinus. Together, the findings are suggestive of acute on chronic sinusitis. Orbits are unremarkable. Other: Mastoid air cells and middle ear cavities are clear. IMPRESSION: 1. No acute intracranial abnormality. 2. Findings suggestive of acute on chronic sinusitis, most notably involving the left maxillary sinus. 3. Mild periventricular white matter changes likely reflecting the sequela of small vessel ischemia. Electronically Signed   By: Helyn Numbers MD   On: 07/28/2020 18:56   DG CHEST PORT 1 VIEW  Result Date: 08/14/2020 CLINICAL DATA:  Shortness of breath, COVID-19. EXAM: PORTABLE CHEST 1 VIEW COMPARISON:  October 10, 2020. FINDINGS: The heart size and mediastinal contours are within normal limits. Stable bilateral lung opacities are noted consistent with multifocal pneumonia. Hypoinflation of the lungs is noted. No pneumothorax is noted. Small pleural effusions cannot be excluded. The visualized skeletal structures are unremarkable. IMPRESSION: Stable bilateral lung opacities are noted consistent with multifocal pneumonia. Electronically Signed   By: Lupita Raider M.D.   On: 07/21/2020 09:31   DG CHEST PORT 1 VIEW  Result Date: 08/10/2020 CLINICAL DATA:  Encounter for hypoxia. COVID + EXAM: PORTABLE CHEST 1 VIEW COMPARISON:  None. FINDINGS: Cardiac silhouette is partially obscured by  airspace disease. Extremely low lung volumes. There is bibasilar airspace disease as well as RIGHT upper lobe airspace disease. No pneumothorax. IMPRESSION: Bilateral airspace disease consistent with COVID pneumonia. Very low lung volumes. Electronically Signed   By: Genevive BiStewart  Edmunds M.D.   On: 08/10/2020 16:41   DG Chest Port 1  View  Result Date: 08/10/2020 CLINICAL DATA:  Shortness of breath EXAM: PORTABLE CHEST 1 VIEW COMPARISON:  01/27/2014 FINDINGS: Low lung volumes. Left basilar airspace opacity, new since prior study. Heart is normal size. Right lung clear. No effusions or acute bony abnormality. IMPRESSION: Low lung volumes. Left basilar airspace opacity concerning for pneumonia. Electronically Signed   By: Charlett NoseKevin  Dover M.D.   On: 08/14/2020 23:03   DG Abd Portable 1V  Result Date: 07/31/2020 CLINICAL DATA:  Nausea and vomiting.  History of sepsis. EXAM: PORTABLE ABDOMEN - 1 VIEW COMPARISON:  None. FINDINGS: Gas is demonstrated within nondilated loops of large and small bowel. Supine evaluation limited for the detection of free intraperitoneal air. Lumbar spine degenerative changes. IMPRESSION: Nonobstructed bowel gas pattern. Electronically Signed   By: Annia Beltrew  Davis M.D.   On: 07/31/2020 10:01   VAS US ABI WITH/WO TBI  Result Date: 08/02/2020 LOWER EXTREMITY DOPPLER STUDY Indications: Ulceration, and bilateral toe ulcers.  Limitations: Today's exam was limited due to bandages. Performing Technologist: Blanch MediaMegan Riddle RVS  Examination Guidelines: Todd Farrell complete evaluation includes at minimum, Doppler waveform signals and systolic blood pressure reading at the level of bilateral brachial, anterior tibial, and posterior tibial arteries, when vessel segments are accessible. Bilateral testing is considered an integral part of Todd Farrell complete examination. Photoelectric Plethysmograph (PPG) waveforms and toe systolic pressure readings are included as required and additional duplex testing as needed. Limited examinations for reoccurring indications may be performed as noted.  ABI Findings: +--------+------------------+-----+---------+--------+ Right   Rt Pressure (mmHg)IndexWaveform Comment  +--------+------------------+-----+---------+--------+ [id number]                    triphasic          +--------+------------------+-----+---------+--------+ PTA                            absent            +--------+------------------+-----+---------+--------+ DP                             absent            +--------+------------------+-----+---------+--------+ +--------+------------------+-----+---------+-------+ Left    Lt Pressure (mmHg)IndexWaveform Comment +--------+------------------+-----+---------+-------+ [id number]                    triphasic        +--------+------------------+-----+---------+-------+ PTA                            absent           +--------+------------------+-----+---------+-------+ DP                             absent           +--------+------------------+-----+---------+-------+ +-------+-----------+-----------+------------+------------+ ABI/TBIToday's ABIToday's TBIPrevious ABIPrevious TBI +-------+-----------+-----------+------------+------------+ Right  0.56                                           +-------+-----------+-----------+------------+------------+  Summary: Right: Resting right ankle-brachial index indicates moderate right lower extremity arterial disease. Left: Resting left ankle-brachial index indicates critical left limb ischemia.  *See table(s) above for measurements and observations.  Electronically signed by Waverly Ferrari MD on 08/02/2020 at 5:09:30 PM.   Final    VAS [place] LOWER EXTREMITY ARTERIAL DUPLEX  Result Date: 08/01/2020 LOWER EXTREMITY ARTERIAL DUPLEX STUDY Indications: Gangrene.  Current ABI: Not obtained Limitations: COVID-19 positive. Comparison Study: No prior study Performing Technologist: Gertie Fey MHA, RDMS, RVT, RDCS  Examination Guidelines: Todd Farrell complete evaluation includes B-mode imaging, spectral Doppler, color Doppler, and power Doppler as needed of all accessible portions of each vessel. Bilateral testing is considered an integral part of Todd Farrell complete examination. Limited  examinations for reoccurring indications may be performed as noted.  +----------+--------+-----+--------+----------+--------+ RIGHT     PSV cm/sRatioStenosisWaveform  Comments +----------+--------+-----+--------+----------+--------+ CFA Distal30                   biphasic           +----------+--------+-----+--------+----------+--------+ DFA                    occluded                   +----------+--------+-----+--------+----------+--------+ SFA Prox  38                   biphasic           +----------+--------+-----+--------+----------+--------+ SFA Mid   30                   biphasic           +----------+--------+-----+--------+----------+--------+ SFA Distal31                   biphasic           +----------+--------+-----+--------+----------+--------+ POP Prox  31                   biphasic           +----------+--------+-----+--------+----------+--------+ TP Trunk  46                   biphasic           +----------+--------+-----+--------+----------+--------+ ATA Distal28                   biphasic           +----------+--------+-----+--------+----------+--------+ PTA Distal12                   monophasic         +----------+--------+-----+--------+----------+--------+  +-----------+--------+-----+--------+----------+--------+ LEFT       PSV cm/sRatioStenosisWaveform  Comments +-----------+--------+-----+--------+----------+--------+ CFA Distal 36                   biphasic           +-----------+--------+-----+--------+----------+--------+ DFA        36                   biphasic           +-----------+--------+-----+--------+----------+--------+ SFA Prox   29                   biphasic           +-----------+--------+-----+--------+----------+--------+ SFA Mid    35                   biphasic           +-----------+--------+-----+--------+----------+--------+  SFA Distal 24                    biphasic           +-----------+--------+-----+--------+----------+--------+ POP Distal 22                   biphasic           +-----------+--------+-----+--------+----------+--------+ TP Trunk   30                   biphasic           +-----------+--------+-----+--------+----------+--------+ ATA Distal 29                   monophasic         +-----------+--------+-----+--------+----------+--------+ PTA Prox                occluded                   +-----------+--------+-----+--------+----------+--------+ PTA Distal              occluded                   +-----------+--------+-----+--------+----------+--------+ PERO Distal             occluded                   +-----------+--------+-----+--------+----------+--------+   Summary: Right: Total occlusion noted in the deep femoral artery. Left: Total occlusion noted in the posterior tibial artery. Total occlusion noted in the peroneal artery.  See table(s) above for measurements and observations. Electronically signed by Sherald Hess MD on 08/01/2020 at 3:35:05 PM.    Final    VAS [place] LOWER EXTREMITY VENOUS (DVT)  Result Date: 08/01/2020  Lower Venous DVTStudy Indications: COVID-19 positive.  Comparison Study: No prior study Performing Technologist: Gertie Fey MHA, RDMS, RVT, RDCS  Examination Guidelines: Todd Farrell complete evaluation includes B-mode imaging, spectral Doppler, color Doppler, and power Doppler as needed of all accessible portions of each vessel. Bilateral testing is considered an integral part of Todd Farrell complete examination. Limited examinations for reoccurring indications may be performed as noted. The reflux portion of the exam is performed with the patient in reverse Trendelenburg.  +---------+---------------+---------+-----------+----------+--------------+ RIGHT    CompressibilityPhasicitySpontaneityPropertiesThrombus Aging  +---------+---------------+---------+-----------+----------+--------------+ CFV      Full           Yes      Yes                                 +---------+---------------+---------+-----------+----------+--------------+ SFJ      Full                                                        +---------+---------------+---------+-----------+----------+--------------+ FV Prox  Full                                                        +---------+---------------+---------+-----------+----------+--------------+ FV Mid   Full                                                        +---------+---------------+---------+-----------+----------+--------------+  FV DistalFull                                                        +---------+---------------+---------+-----------+----------+--------------+ PFV      Full                                                        +---------+---------------+---------+-----------+----------+--------------+ POP      Full           Yes      Yes                                 +---------+---------------+---------+-----------+----------+--------------+ PTV      None                    No                   Acute          +---------+---------------+---------+-----------+----------+--------------+ PERO                                                  Not visualized +---------+---------------+---------+-----------+----------+--------------+   +---------+---------------+---------+-----------+----------+--------------+ LEFT     CompressibilityPhasicitySpontaneityPropertiesThrombus Aging +---------+---------------+---------+-----------+----------+--------------+ CFV      Full           Yes      Yes                                 +---------+---------------+---------+-----------+----------+--------------+ SFJ      Full                                                         +---------+---------------+---------+-----------+----------+--------------+ FV Prox  Full                                                        +---------+---------------+---------+-----------+----------+--------------+ FV Mid   Full                                                        +---------+---------------+---------+-----------+----------+--------------+ FV DistalFull                                                        +---------+---------------+---------+-----------+----------+--------------+ PFV  Full                                                        +---------+---------------+---------+-----------+----------+--------------+ POP      Full           Yes      Yes                                 +---------+---------------+---------+-----------+----------+--------------+ PTV                              Yes                                 +---------+---------------+---------+-----------+----------+--------------+ PERO                             Yes                                 +---------+---------------+---------+-----------+----------+--------------+   Left Technical Findings: Not visualized segments include limited evaluation of left PTV and peroneal veins.   Summary: RIGHT: - Findings consistent with acute deep vein thrombosis involving the right posterior tibial veins. - No cystic structure found in the popliteal fossa.  LEFT: - There is no evidence of deep vein thrombosis in the lower extremity. However, portions of this examination were limited- see technologist comments above.  - No cystic structure found in the popliteal fossa.  *See table(s) above for measurements and observations. Electronically signed by Sherald Hess MD on 08/01/2020 at 3:34:15 PM.    Final    HYBRID OR IMAGING ([facility] ONLY)  Result Date: 08/15/2020 There is no interpretation for this exam.  This order is for images obtained during Todd Farrell surgical procedure.  Please  See "Surgeries" Tab for more information regarding the procedure.    Microbiology Recent Results (from the past 240 hour(s))  Culture, blood (routine x 2)     Status: None (Preliminary result)   Collection Time: 08/10/20  7:07 PM   Specimen: BLOOD  Result Value Ref Range Status   Specimen Description BLOOD RIGHT ANTECUBITAL  Final   Special Requests   Final    BOTTLES DRAWN AEROBIC AND ANAEROBIC Blood Culture adequate volume   Culture   Final    NO GROWTH < 12 HOURS Performed at [facility], [address], [place], [place] [address]    Report Status PENDING  Incomplete  Culture, blood (routine x 2)     Status: None (Preliminary result)   Collection Time: 08/10/20  7:07 PM   Specimen: BLOOD RIGHT HAND  Result Value Ref Range Status   Specimen Description BLOOD RIGHT HAND  Final   Special Requests   Final    BOTTLES DRAWN AEROBIC ONLY Blood Culture results may not be optimal due to an inadequate volume of blood received in culture bottles   Culture   Final    NO GROWTH < 12 HOURS Performed at [facility], [address], [place], [place] [address]    Report Status PENDING  Incomplete    Lab Basic Metabolic Panel: Recent Labs  Lab 08/05/20 0339 08/05/20 0339 08/06/20 0352 08/07/20 0900 08/08/20 0739 08/10/20 1100 07/24/2020 0324  NA 133*   < > 139 138 139 139 135  K 5.2*   < > 5.7* 3.6 4.0 4.4 4.7  CL 108   < > 108 105 109 109 105  CO2 14*   < > 20* 23 21* 21* 17*  GLUCOSE 282*   < > 78 183* 206* 211* 313*  BUN 31*   < > 29* 28* 24* 19 23  CREATININE 1.66*   < > 1.37* 1.60* 1.40* 1.36* 1.84*  CALCIUM 7.9*   < > 7.5* 7.6* 7.5* 7.4* 7.4*  MG 1.9  --  1.8  --   --   --  1.4*  PHOS  --   --   --   --   --   --  2.4*   < > = values in this interval not displayed.   Liver Function Tests: Recent Labs  Lab 08/10/20 1100 07/23/2020 0324  AST 31 24  ALT 24 23  ALKPHOS 83 81  BILITOT 1.4* 1.3*  PROT 5.0* 5.2*  ALBUMIN 1.5* 1.5*   No results for input(s):  LIPASE, AMYLASE in the last 168 hours. No results for input(s): AMMONIA in the last 168 hours. CBC: Recent Labs  Lab 08/05/20 0339 08/05/20 0339 08/06/20 0352 08/06/20 0352 08/07/20 1250 08/07/20 1250 08/08/20 0355 08/08/20 0739 08/09/20 0818 08/10/20 0236 08/10/20 1635  WBC 16.9*   < > 13.1*  --  11.9*  --  12.4*  --  15.2* 16.6*  --   NEUTROABS 14.9*  --  11.1*  --   --   --   --   --   --   --   --   HGB 12.1*   < > 12.3*   < > 9.2*   < > 8.2* 8.0* 8.0* 7.3* 8.4*  HCT 38.2*   < > 38.2*   < > 28.6*   < > 25.4* 25.2* 25.7* 23.1* 25.6*  MCV 88.4   < > 87.4  --  88.0  --  88.5  --  91.1 90.2  --   PLT 184   < > 130*  --  143*  --  135*  --  114* 178  --    < > = values in this interval not displayed.   Cardiac Enzymes: No results for input(s): CKTOTAL, CKMB, CKMBINDEX, TROPONINI in the last 168 hours. Sepsis Labs: Recent Labs  Lab 08/07/20 1250 08/08/20 0355 08/09/20 0818 08/10/20 0236 08/10/20 1933 07/30/2020 0324  PROCALCITON  --   --   --   --  0.66 1.13  WBC 11.9* 12.4* 15.2* 16.6*  --   --   LATICACIDVEN  --   --   --   --   --  3.0*    Procedures/Operations  See previous notes   Todd Farrell 07/29/2020, 11:55 AM

## 2020-08-17 NOTE — Anesthesia Preprocedure Evaluation (Deleted)
Anesthesia Evaluation    Reviewed: Allergy & Precautions, Patient's Chart, lab work & pertinent test results  History of Anesthesia Complications Negative for: history of anesthetic complications  Airway        Dental   Pulmonary pneumonia,           Cardiovascular + Peripheral Vascular Disease and + DVT       Neuro/Psych negative neurological ROS  negative psych ROS   GI/Hepatic negative GI ROS, Neg liver ROS,   Endo/Other  diabetes, Poorly Controlled, Type 2  Renal/GU CRFRenal disease     Musculoskeletal negative musculoskeletal ROS (+)   Abdominal   Peds  Hematology  (+) anemia ,   Anesthesia Other Findings Covid+ 08/10/2020   Reproductive/Obstetrics                             Anesthesia Physical Anesthesia Plan  ASA: IV  Anesthesia Plan: General   Post-op Pain Management:    Induction: Intravenous  PONV Risk Score and Plan: 2 and Treatment may vary due to age or medical condition, Ondansetron and Dexamethasone  Airway Management Planned: Oral ETT  Additional Equipment: None  Intra-op Plan:   Post-operative Plan: Extubation in OR  Informed Consent:   Plan Discussed with: CRNA and Anesthesiologist  Anesthesia Plan Comments:         Anesthesia Quick Evaluation

## 2020-08-17 NOTE — Procedures (Signed)
Cardiopulmonary Resuscitation Note  Todd Farrell  [id number]  1956-09-17  Date:07/19/2020  Time:11:41 AM   Provider Performing:Lilly Gasser   Procedure: Cardiopulmonary Resuscitation (92950)  Indication(s) Loss of Pulse  Consent N/A  Anesthesia N/A   Time Out N/A   Sterile Technique Hand hygiene, gloves   Procedure Description Called to patient's room for CODE BLUE. Initial rhythm was PEA/Asystole. Patient received high quality intermittently chest compressions for over 60 mins  with defibrillation or cardioversion when appropriate. Epinephrine was administered every 3 minutes as directed by time Biomedical engineer. Additional pharmacologic interventions included calcium chloride and sodium bicarbonate. 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  Additional procedural interventions include intra-osseus line.  Return of spontaneous circulation was not achieved. Time of death 11:06 am  Family called and notified.   Complications/Tolerance N/A   EBL N/A   Specimen(s) N/A  [name] MD [facility] Please see [email or web address] for pager details.  08/10/2020, 11:43 AM

## 2020-08-17 NOTE — Progress Notes (Signed)
  Echocardiogram 2D Echocardiogram has been attempted. Patient in unstable condition. Will reattempt echo at later time per RN.  Todd Farrell Leathers 08/15/2020, 10:33 AM

## 2020-08-17 DEATH — deceased

## 2021-09-07 IMAGING — CT CT HEAD W/O CM
3 of 4 series · 15 of 47 positions shown, 18 images · non-contrast
Comparison: None.

CLINICAL DATA: Delirium, altered mental status

EXAM:
CT HEAD WITHOUT CONTRAST
TECHNIQUE: Contiguous axial images were obtained from the base of the skull
through the vertex without intravenous contrast.

[Series 4: head 2.0 h70h · axial · 0.40mm/px · z∈[+1121,+1261]mm · 9 of 88 slices shown, 12 images]
[im 9/88  brain]
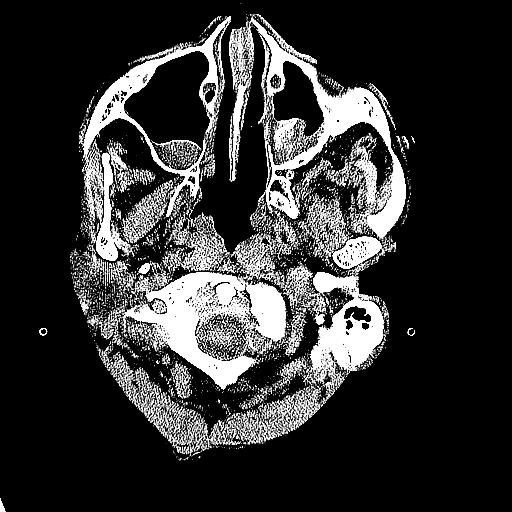
[im 9/88  bone]
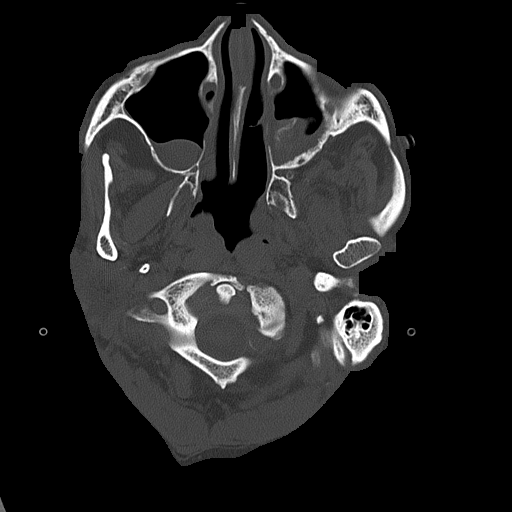
[im 18/88  brain]
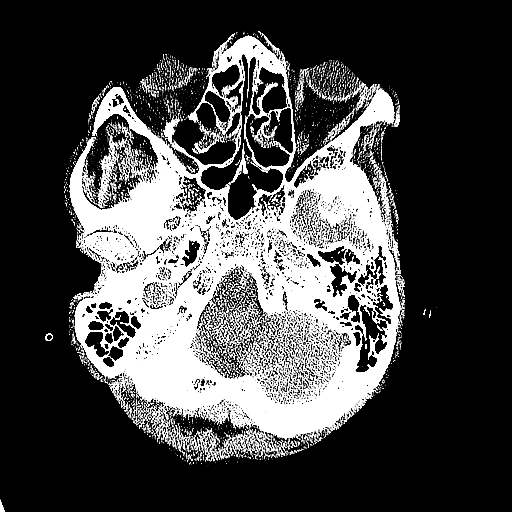
[im 27/88  brain]
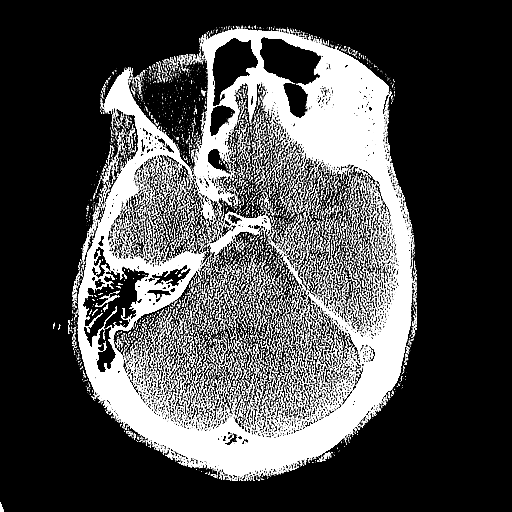
[im 35/88  brain]
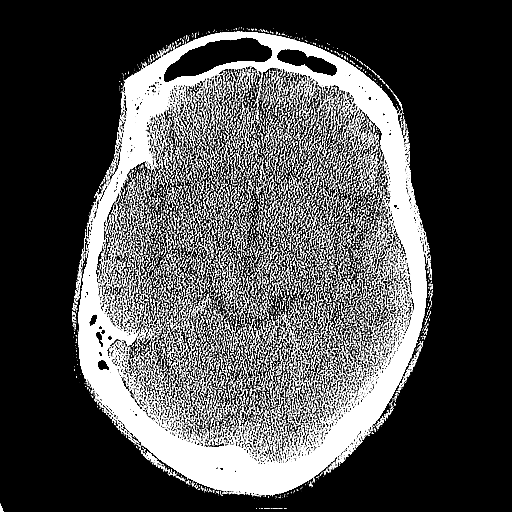
[im 44/88  brain]
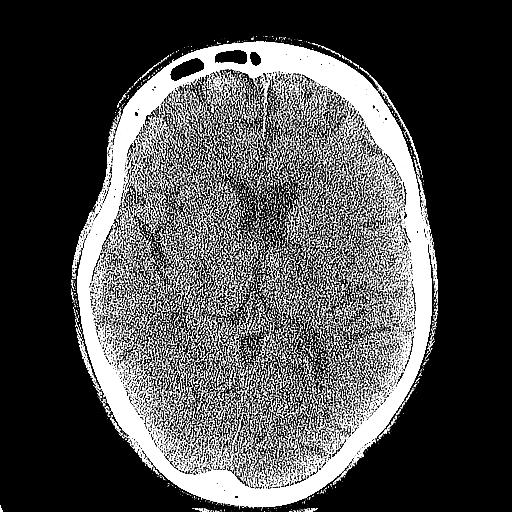
[im 44/88  bone]
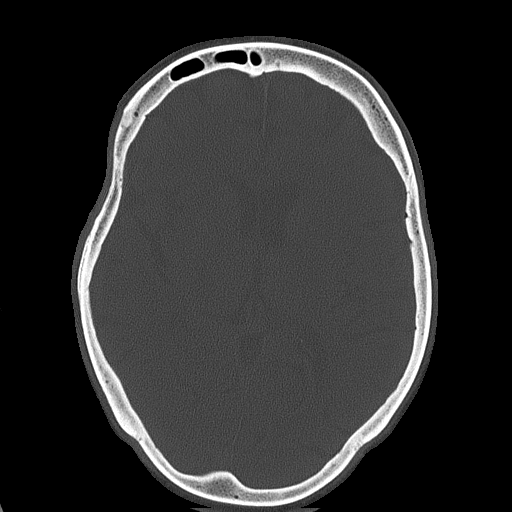
[im 53/88  brain]
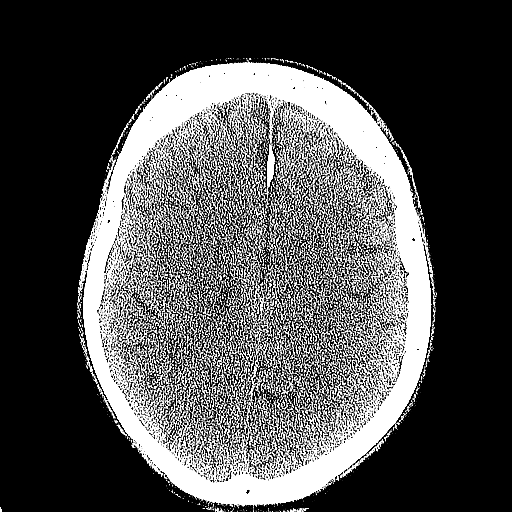
[im 61/88  brain]
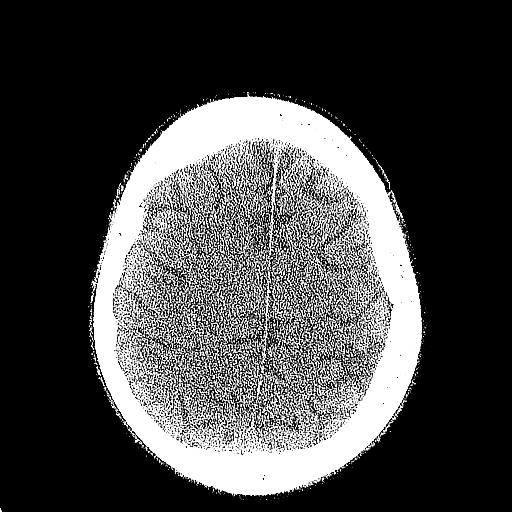
[im 70/88  brain]
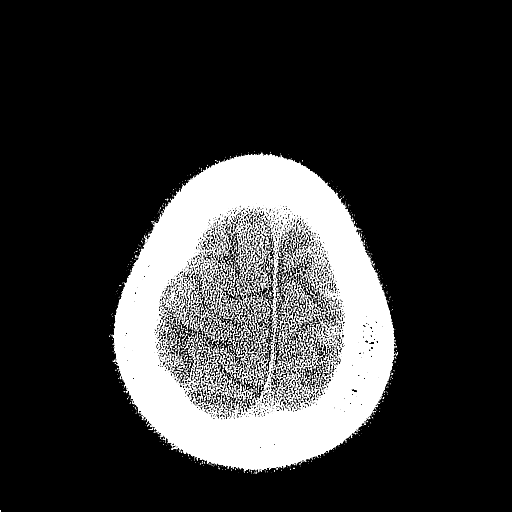
[im 79/88  brain]
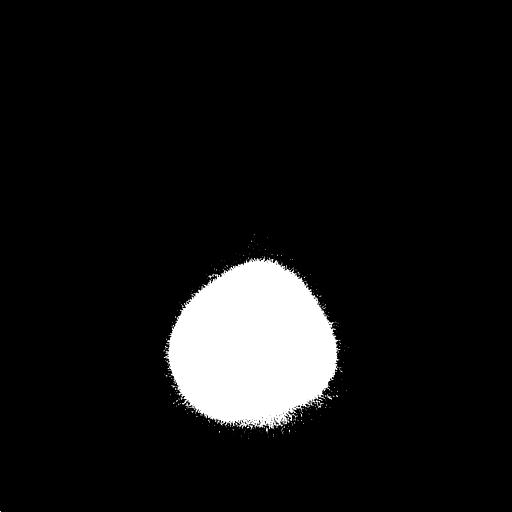
[im 79/88  bone]
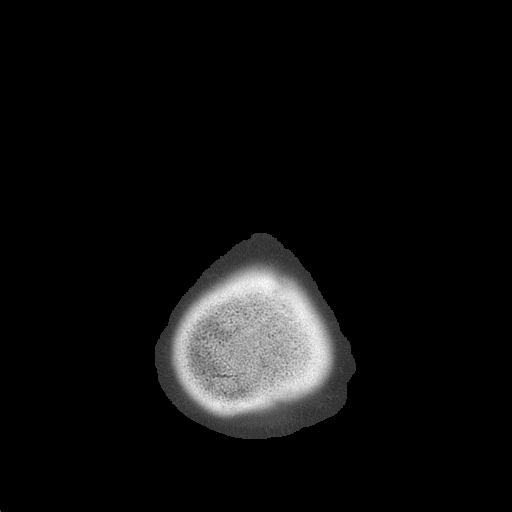

[Series 5: head 3.0 mpr cor · coronal · 0.34mm/px · 3 of 67 slices shown]
[im 23/67  brain]
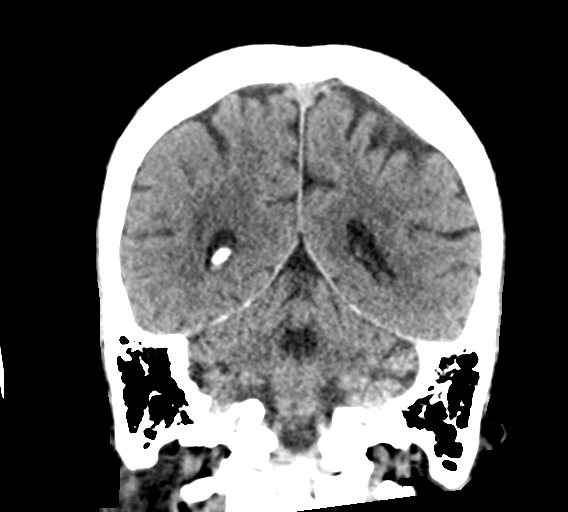
[im 30/67  brain]
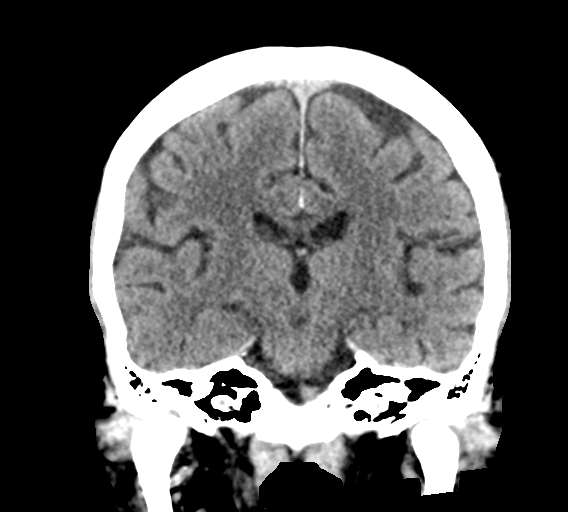
[im 37/67  brain]
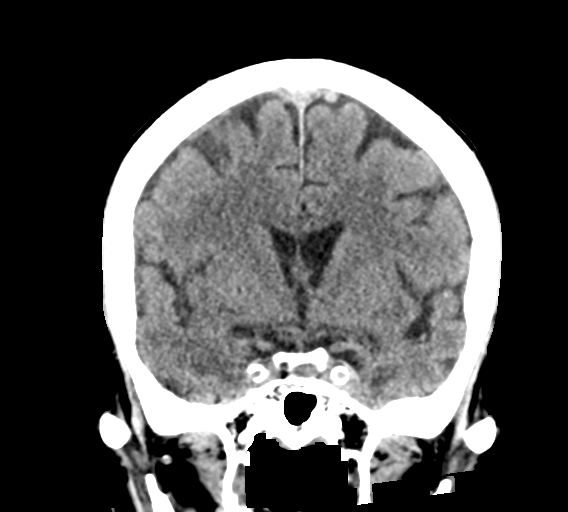

[Series 6: head 3.0 mpr sag · sagittal · 0.34mm/px · 3 of 66 slices shown]
[im 26/66  brain]
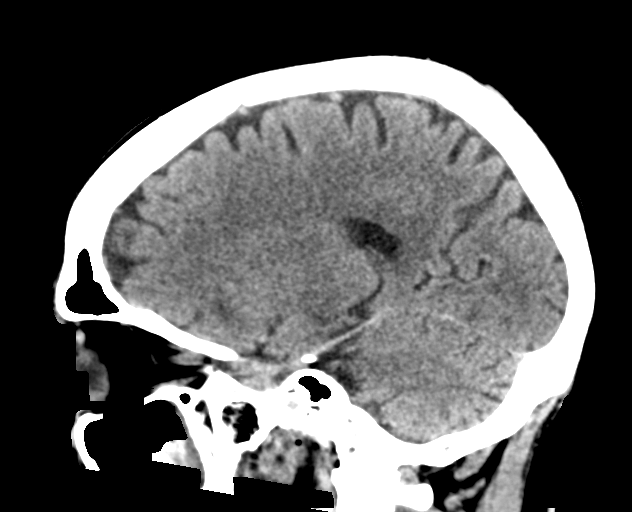
[im 33/66  brain]
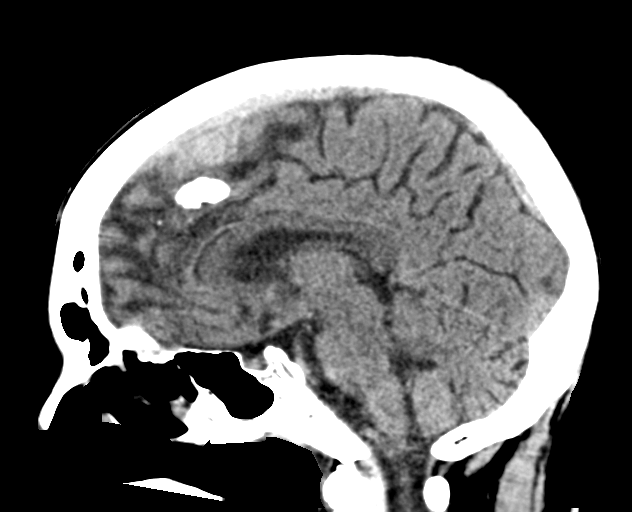
[im 40/66  brain]
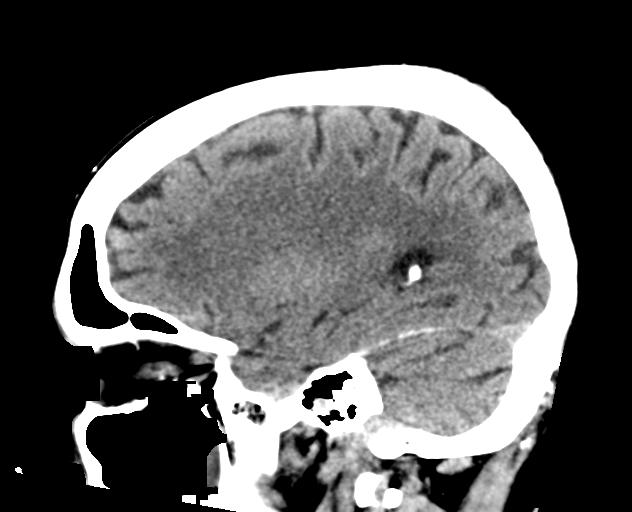

[15 of 47 positions shown; findings below may reference images not displayed]

FINDINGS: Brain: Normal anatomic configuration. Mild periventricular white
matter changes are present likely reflecting the sequela of small
vessel ischemia. No abnormal intra or extra-axial mass lesion or
fluid collection. No abnormal mass effect or midline shift. No
evidence of acute intracranial hemorrhage or infarct. Ventricular
size is normal. Cerebellum unremarkable.

Vascular: Unremarkable

Skull: Intact

Sinuses/Orbits: Moderate to severe mucosal thickening and debris
within the left maxillary sinus with dystrophic calcification within
the intraluminal debris in keeping with chronic or fungal sinusitis.
There is mild mucosal thickening within the ethmoid air cells, and
sphenoid sinuses as well as a small air-fluid level within the right
maxillary sinus. Together, the findings are suggestive of acute on
chronic sinusitis. Orbits are unremarkable.

Other: Mastoid air cells and middle ear cavities are clear.
IMPRESSION: 1. No acute intracranial abnormality.
2. Findings suggestive of acute on chronic sinusitis, most notably
involving the left maxillary sinus.
3. Mild periventricular white matter changes likely reflecting the
sequela of small vessel ischemia.

## 2021-09-10 IMAGING — DX DG ABD PORTABLE 1V
1 series · 1 of 1 positions shown · non-contrast
Comparison: None.

CLINICAL DATA: Nausea and vomiting.  History of sepsis.

EXAM:
PORTABLE ABDOMEN - 1 VIEW

[abdomen]
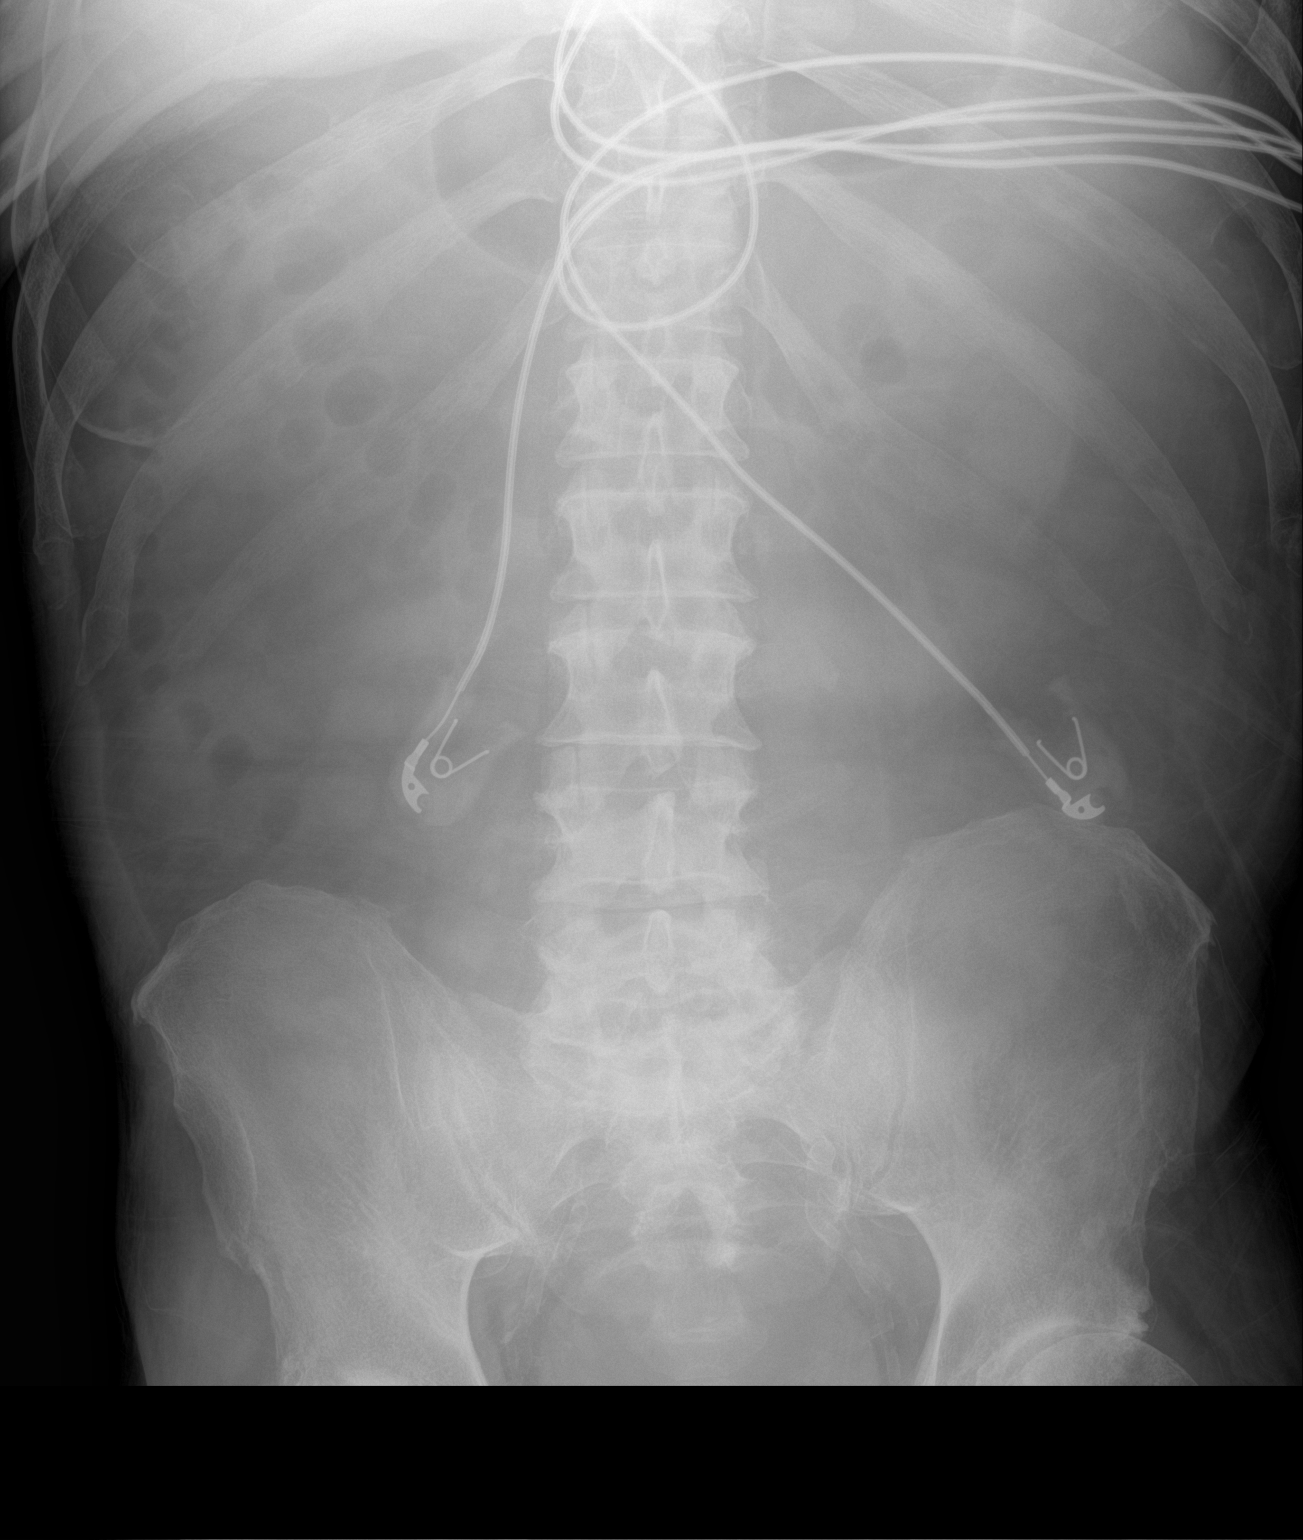

[1 of 1 positions shown; findings below may reference images not displayed]

FINDINGS: Gas is demonstrated within nondilated loops of large and small
bowel. Supine evaluation limited for the detection of free
intraperitoneal air. Lumbar spine degenerative changes.
IMPRESSION: Nonobstructed bowel gas pattern.

## 2021-09-21 IMAGING — DX DG CHEST 1V PORT
1 series · 1 of 1 positions shown · non-contrast
Comparison: October 10, 2020.

CLINICAL DATA: Shortness of breath, [id number].

EXAM:
PORTABLE CHEST 1 VIEW

[chest]
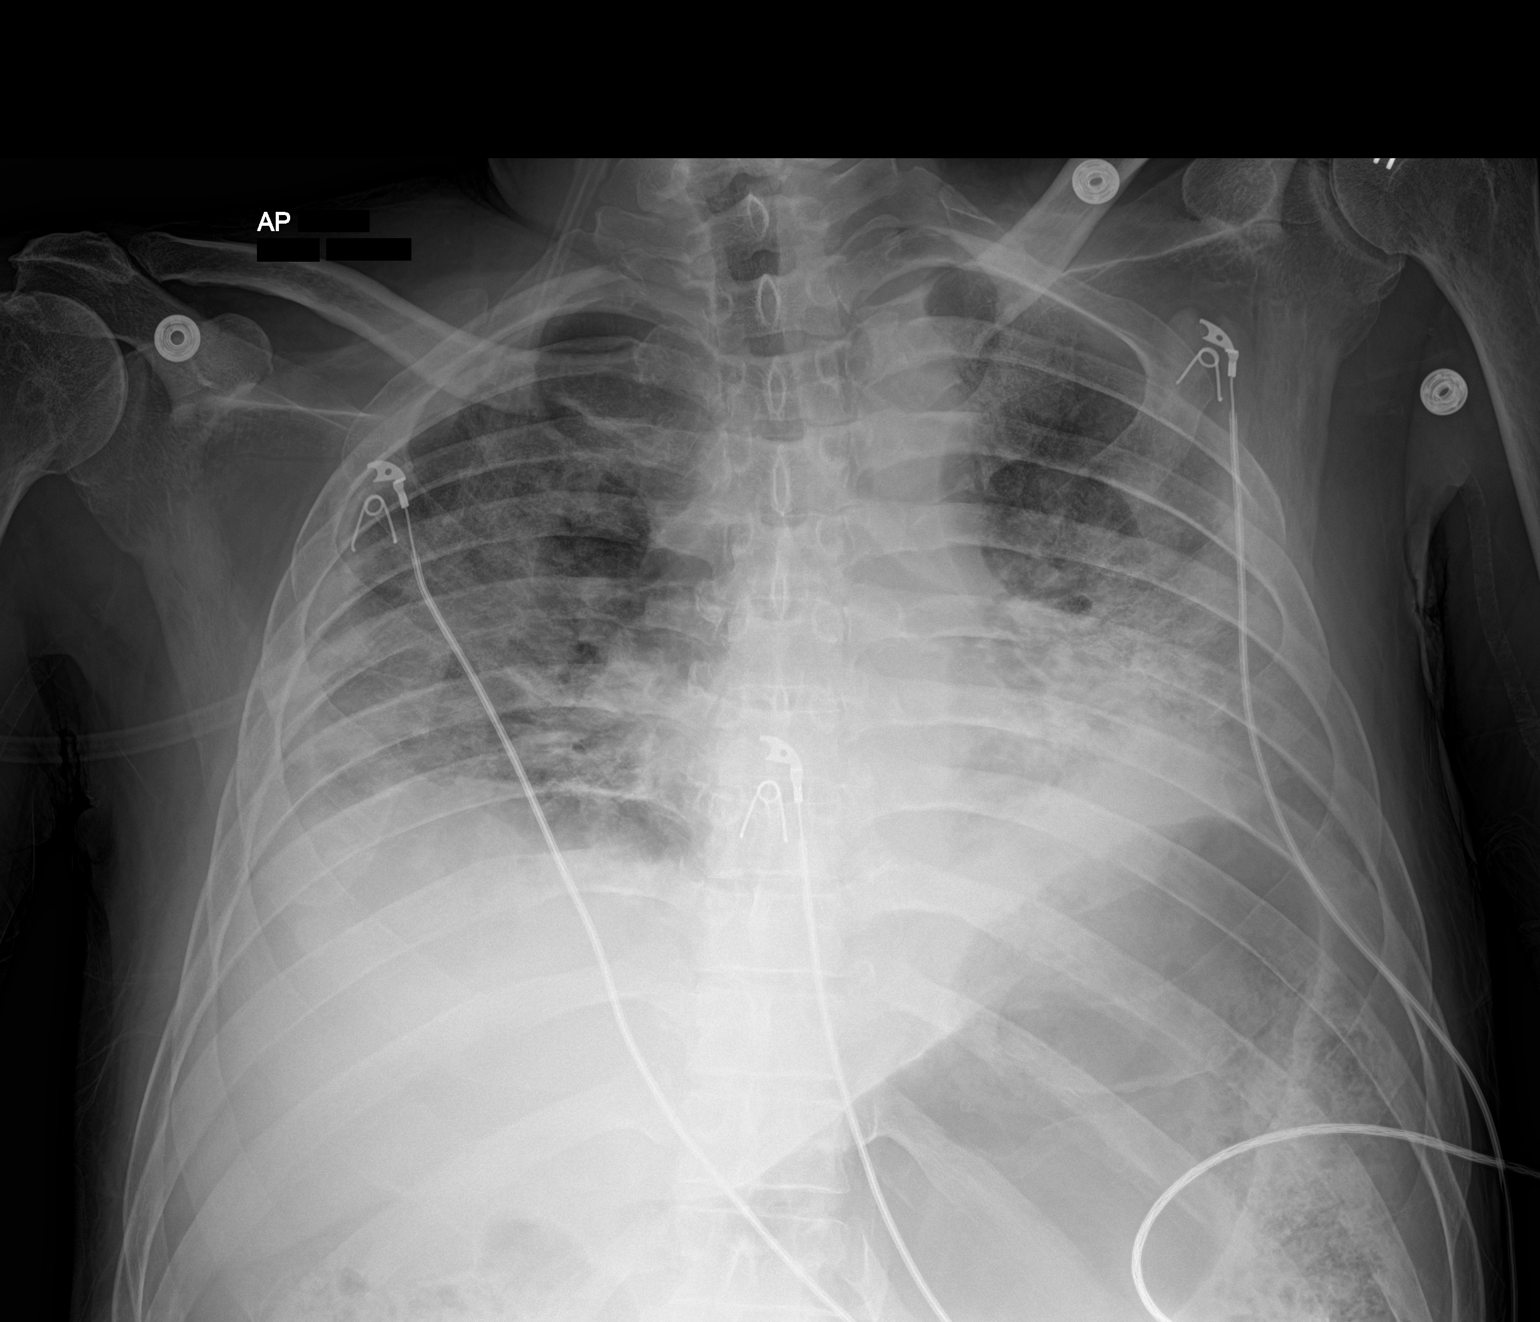

[1 of 1 positions shown; findings below may reference images not displayed]

FINDINGS: The heart size and mediastinal contours are within normal limits.
Stable bilateral lung opacities are noted consistent with multifocal
pneumonia. Hypoinflation of the lungs is noted. No pneumothorax is
noted. Small pleural effusions cannot be excluded. The visualized
skeletal structures are unremarkable.
IMPRESSION: Stable bilateral lung opacities are noted consistent with multifocal
pneumonia.
